# Patient Record
Sex: Male | Born: 1955
Health system: Southern US, Community
[De-identification: ages and names within clinical notes are randomized; demographics above are authoritative.]

## PROBLEM LIST (undated history)

## (undated) DIAGNOSIS — I351 Nonrheumatic aortic (valve) insufficiency: Secondary | ICD-10-CM

## (undated) DIAGNOSIS — R002 Palpitations: Secondary | ICD-10-CM

## (undated) DIAGNOSIS — R079 Chest pain, unspecified: Secondary | ICD-10-CM

## (undated) DIAGNOSIS — N200 Calculus of kidney: Secondary | ICD-10-CM

## (undated) DIAGNOSIS — Q231 Congenital insufficiency of aortic valve: Secondary | ICD-10-CM

## (undated) DIAGNOSIS — R911 Solitary pulmonary nodule: Secondary | ICD-10-CM

## (undated) DIAGNOSIS — I77810 Thoracic aortic ectasia: Secondary | ICD-10-CM

## (undated) DIAGNOSIS — B349 Viral infection, unspecified: Secondary | ICD-10-CM

## (undated) DIAGNOSIS — I10 Essential (primary) hypertension: Secondary | ICD-10-CM

## (undated) DIAGNOSIS — I493 Ventricular premature depolarization: Secondary | ICD-10-CM

## (undated) DIAGNOSIS — Z9049 Acquired absence of other specified parts of digestive tract: Secondary | ICD-10-CM

## (undated) DIAGNOSIS — R9431 Abnormal electrocardiogram [ECG] [EKG]: Secondary | ICD-10-CM

## (undated) DIAGNOSIS — Z789 Other specified health status: Secondary | ICD-10-CM

## (undated) DIAGNOSIS — R9430 Abnormal result of cardiovascular function study, unspecified: Secondary | ICD-10-CM

## (undated) DIAGNOSIS — E786 Lipoprotein deficiency: Secondary | ICD-10-CM

## (undated) DIAGNOSIS — I2510 Atherosclerotic heart disease of native coronary artery without angina pectoris: Secondary | ICD-10-CM

## (undated) DIAGNOSIS — IMO0002 Reserved for concepts with insufficient information to code with codable children: Secondary | ICD-10-CM

## (undated) HISTORY — DX: Reserved for concepts with insufficient information to code with codable children: IMO0002

## (undated) HISTORY — DX: Ventricular premature depolarization: I49.3

## (undated) HISTORY — DX: Other specified health status: Z78.9

## (undated) HISTORY — DX: Abnormal result of cardiovascular function study, unspecified: R94.30

## (undated) HISTORY — DX: Viral infection, unspecified: B34.9

## (undated) HISTORY — DX: Thoracic aortic ectasia: I77.810

## (undated) HISTORY — DX: Chest pain, unspecified: R07.9

## (undated) HISTORY — PX: KNEE ARTHROSCOPY: SHX127

## (undated) HISTORY — DX: Congenital insufficiency of aortic valve: Q23.1

## (undated) HISTORY — DX: Palpitations: R00.2

## (undated) HISTORY — DX: Abnormal electrocardiogram (ECG) (EKG): R94.31

## (undated) HISTORY — DX: Solitary pulmonary nodule: R91.1

## (undated) HISTORY — DX: Acquired absence of other specified parts of digestive tract: Z90.49

## (undated) HISTORY — DX: Nonrheumatic aortic (valve) insufficiency: I35.1

## (undated) HISTORY — DX: Essential (primary) hypertension: I10

## (undated) HISTORY — DX: Calculus of kidney: N20.0

## (undated) HISTORY — DX: Lipoprotein deficiency: E78.6

## (undated) HISTORY — PX: CHOLECYSTECTOMY: SHX55

## (undated) HISTORY — DX: Atherosclerotic heart disease of native coronary artery without angina pectoris: I25.10

---

## 1998-01-14 ENCOUNTER — Encounter: Payer: Self-pay | Admitting: Cardiology

## 2005-01-16 ENCOUNTER — Encounter: Admission: RE | Admit: 2005-01-16 | Discharge: 2005-01-16 | Payer: Self-pay | Admitting: Family Medicine

## 2005-02-02 ENCOUNTER — Encounter: Admission: RE | Admit: 2005-02-02 | Discharge: 2005-02-02 | Payer: Self-pay | Admitting: Family Medicine

## 2005-10-27 ENCOUNTER — Encounter: Admission: RE | Admit: 2005-10-27 | Discharge: 2005-10-27 | Payer: Self-pay | Admitting: Orthopedic Surgery

## 2005-11-22 ENCOUNTER — Ambulatory Visit (HOSPITAL_BASED_OUTPATIENT_CLINIC_OR_DEPARTMENT_OTHER): Admission: RE | Admit: 2005-11-22 | Discharge: 2005-11-22 | Payer: Self-pay | Admitting: Orthopedic Surgery

## 2008-04-02 DIAGNOSIS — R9431 Abnormal electrocardiogram [ECG] [EKG]: Secondary | ICD-10-CM

## 2008-04-02 HISTORY — DX: Abnormal electrocardiogram (ECG) (EKG): R94.31

## 2008-07-31 DIAGNOSIS — B349 Viral infection, unspecified: Secondary | ICD-10-CM | POA: Insufficient documentation

## 2008-07-31 HISTORY — DX: Viral infection, unspecified: B34.9

## 2008-08-31 ENCOUNTER — Encounter: Payer: Self-pay | Admitting: Cardiology

## 2008-09-30 DIAGNOSIS — I351 Nonrheumatic aortic (valve) insufficiency: Secondary | ICD-10-CM

## 2008-09-30 DIAGNOSIS — Q231 Congenital insufficiency of aortic valve: Secondary | ICD-10-CM

## 2008-09-30 DIAGNOSIS — Q2381 Bicuspid aortic valve: Secondary | ICD-10-CM

## 2008-09-30 DIAGNOSIS — R002 Palpitations: Secondary | ICD-10-CM

## 2008-09-30 DIAGNOSIS — R079 Chest pain, unspecified: Secondary | ICD-10-CM

## 2008-09-30 DIAGNOSIS — I77810 Thoracic aortic ectasia: Secondary | ICD-10-CM | POA: Insufficient documentation

## 2008-09-30 HISTORY — DX: Palpitations: R00.2

## 2008-09-30 HISTORY — DX: Thoracic aortic ectasia: I77.810

## 2008-09-30 HISTORY — DX: Nonrheumatic aortic (valve) insufficiency: I35.1

## 2008-09-30 HISTORY — DX: Bicuspid aortic valve: Q23.81

## 2008-09-30 HISTORY — DX: Chest pain, unspecified: R07.9

## 2008-09-30 HISTORY — DX: Congenital insufficiency of aortic valve: Q23.1

## 2008-10-15 ENCOUNTER — Ambulatory Visit: Payer: Self-pay

## 2008-10-15 ENCOUNTER — Ambulatory Visit: Payer: Self-pay | Admitting: Cardiology

## 2008-10-25 ENCOUNTER — Ambulatory Visit: Payer: Self-pay

## 2008-10-25 ENCOUNTER — Ambulatory Visit: Payer: Self-pay | Admitting: Cardiology

## 2008-10-25 ENCOUNTER — Encounter: Payer: Self-pay | Admitting: Cardiology

## 2008-10-25 LAB — CONVERTED CEMR LAB
Cholesterol: 163 mg/dL (ref 0–200)
HDL: 32.6 mg/dL — ABNORMAL LOW (ref >39.00)
LDL Cholesterol: 111 mg/dL — ABNORMAL HIGH (ref 0–99)
Total CHOL/HDL Ratio: 5
Triglycerides: 97 mg/dL (ref 0.0–149.0)
VLDL: 19.4 mg/dL (ref 0.0–40.0)

## 2008-11-05 ENCOUNTER — Encounter: Payer: Self-pay | Admitting: Cardiology

## 2008-11-11 ENCOUNTER — Encounter: Payer: Self-pay | Admitting: Cardiology

## 2009-05-04 ENCOUNTER — Encounter: Payer: Self-pay | Admitting: Cardiology

## 2009-08-24 ENCOUNTER — Emergency Department (HOSPITAL_COMMUNITY): Admission: EM | Admit: 2009-08-24 | Discharge: 2009-08-24 | Payer: Self-pay | Admitting: Emergency Medicine

## 2009-09-21 ENCOUNTER — Encounter: Payer: Self-pay | Admitting: Internal Medicine

## 2009-10-03 ENCOUNTER — Encounter: Payer: Self-pay | Admitting: Cardiology

## 2009-10-04 ENCOUNTER — Ambulatory Visit (HOSPITAL_COMMUNITY): Admission: RE | Admit: 2009-10-04 | Discharge: 2009-10-04 | Payer: Self-pay | Admitting: Cardiology

## 2009-10-04 ENCOUNTER — Ambulatory Visit: Payer: Self-pay | Admitting: Cardiology

## 2009-10-04 ENCOUNTER — Ambulatory Visit: Payer: Self-pay | Admitting: Internal Medicine

## 2009-10-10 ENCOUNTER — Encounter: Payer: Self-pay | Admitting: Cardiology

## 2009-10-14 ENCOUNTER — Telehealth: Payer: Self-pay | Admitting: Cardiology

## 2009-10-18 ENCOUNTER — Ambulatory Visit: Payer: Self-pay | Admitting: Internal Medicine

## 2009-12-02 ENCOUNTER — Encounter: Payer: Self-pay | Admitting: Cardiology

## 2010-01-23 ENCOUNTER — Telehealth: Payer: Self-pay | Admitting: Cardiovascular Disease

## 2010-01-25 ENCOUNTER — Encounter: Payer: Self-pay | Admitting: Cardiovascular Disease

## 2010-02-10 ENCOUNTER — Encounter: Payer: Self-pay | Admitting: Cardiology

## 2010-04-06 ENCOUNTER — Telehealth: Payer: Self-pay | Admitting: Cardiology

## 2010-04-30 LAB — CONVERTED CEMR LAB
Cholesterol: 154 mg/dL (ref 0–200)
HDL: 28.9 mg/dL — ABNORMAL LOW (ref >39.00)
LDL Cholesterol: 110 mg/dL — ABNORMAL HIGH (ref 0–99)
TSH: 1.96 microintl units/mL (ref 0.35–5.50)
Total CHOL/HDL Ratio: 5
Triglycerides: 76 mg/dL (ref 0.0–149.0)
VLDL: 15.2 mg/dL (ref 0.0–40.0)

## 2010-05-02 NOTE — Miscellaneous (Signed)
  Clinical Lists Changes  Problems: Removed problem of FAMILIAL COMBINED HYPERLIPIDEMIA (ICD-272.2) Added new problem of DYSLIPIDEMIA (ICD-272.4) Added new problem of * BETA BLOCKER INTOLERANCE Added new problem of BICUSPID AORTIC VALVE (ICD-746.4) Added new problem of * DILATED AORTIC ROOT Observations: Added new observation of PAST MED HX: Prolonged QT....mild PREMATURE VENTRICULAR CONTRACTIONS (ICD-427.69) PALPITATIONS (ICD-785.1).Marland KitchenMarland KitchenHolter..09/2008...one short burst  SVT.Marland Kitchen2 AM (he may have felt)..tried Lopressor..did not tolerate Beta Blocker intolerance   09/2008 Cholecystectomy Knee arthroscopy X4 Shoulder and clavicle procedure Prolonged viral illness may, 2010. HDL  low...Marland KitchenLDL  110.Marland KitchenMarland Kitchen7/2010 Chest Pain   Stress echo....09/2008...Marland Kitchennormal EF  50-55%...echo.Marland Kitchen.09/2008 Bicuspid aortic valve...echo.Marland Kitchen.09/2008 Dilated aortic root...  echo..09/2008.Marland KitchenMarland Kitchen44-32mm (10/03/2009 16:03) Added new observation of PRIMARY MD: Catha Gosselin, MD (10/03/2009 16:03)       Past History:  Past Medical History: Prolonged QT....mild PREMATURE VENTRICULAR CONTRACTIONS (ICD-427.69) PALPITATIONS (ICD-785.1).Marland KitchenMarland KitchenHolter..09/2008...one short burst  SVT.Marland Kitchen2 AM (he may have felt)..tried Lopressor..did not tolerate Beta Blocker intolerance   09/2008 Cholecystectomy Knee arthroscopy X4 Shoulder and clavicle procedure Prolonged viral illness may, 2010. HDL  low...Marland KitchenLDL  110.Marland KitchenMarland Kitchen7/2010 Chest Pain   Stress echo....09/2008...Marland Kitchennormal EF  50-55%...echo.Marland Kitchen.09/2008 Bicuspid aortic valve...echo.Marland Kitchen.09/2008 Dilated aortic root...  echo..09/2008.Marland KitchenMarland Kitchen44-54mm

## 2010-05-02 NOTE — Miscellaneous (Signed)
  Clinical Lists Changes  Observations: Added new observation of CARDIO HPI: CT angio looked good. I called Patient. Cornaries good. Sinus Valsalva 43mm. Otherwise, ascending aorta only 38mm.  Nish reviewed twice. Not sure why echo different. Same Rx for now.  (02/10/2010 8:35) Added new observation of REFERRING MD: Dr. Modesto Charon (02/10/2010 8:35) Added new observation of PRIMARY MD: Leodis Sias, MD   [facility] (02/10/2010 8:35)      Referring Provider:  Dr. Modesto Charon Primary Provider:  Leodis Sias, MD   [facility]   History of Present Illness: CT angio looked good. I called Patient. Cornaries good. Sinus Valsalva 43mm. Otherwise, ascending aorta only 38mm.  Nish reviewed twice. Not sure why echo different. Same Rx for now.   Appended Document:  Herbert Seta,  Please call patient and let him know that I want to see him in the office next March. This is sooner than I told him when I called him. There is no problem. I just want to coordinate all care then and see if any meds need to be adjusted. I want him to continue to randomly check his BP and call us if pressure is above 140/85.  Appended Document:  Left message to call back   Appended Document:  pt aware, reminder placed for March

## 2010-05-02 NOTE — Progress Notes (Signed)
Summary: personal call  Phone Note Call from Patient Call back at [phone]   Caller: Patient Reason for Call: Talk to Nurse Summary of Call: per pt called. personal call Initial call taken by: Lorne Skeens,  October 14, 2009 1:59 PM  Follow-up for Phone Call        spoke w/pt regarding BP readings and starting ramipril, new rx sent in pt will cont. to monitor Meredith Staggers, RN  October 14, 2009 2:13 PM     New/Updated Medications: RAMIPRIL 2.5 MG CAPS (RAMIPRIL) Take one capsule by mouth daily Prescriptions: RAMIPRIL 2.5 MG CAPS (RAMIPRIL) Take one capsule by mouth daily  #30 x 6   Entered by:   Meredith Staggers, RN   Authorized by:   Talitha Givens, MD, [facility]   Signed by:   Meredith Staggers, RN on 10/14/2009   Method used:   Electronically to        [facility] [phone]* (retail)       [address]       [place], [place]  [address]       Ph: [phone]       Fax: [phone]   RxID:   [id number]

## 2010-05-02 NOTE — Assessment & Plan Note (Signed)
Summary: Pulmonary consultation/ eval SPN    Visit Type:  Initial Consult Copy to:  Dr. Modesto Charon Primary Provider/Referring Provider:  Leodis Sias, MD   [facility]  CC:  Abnormal CT Chest.  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  October 18, 2009  1st pulmonary office eval p symptoms right flank similar to previous kidney stones 15 years > ER at [facility] with right kidney stone passed in and all better with incidental rml nodule.  Pt denies any significant sore throat, dysphagia, itching, sneezing,  nasal congestion or excess secretions,  fever, chills, sweats, unintended wt loss, pleuritic or exertional cp, hempoptysis, change in activity tolerance  orthopnea pnd or leg swelling.  Current Medications (verified): 1)  Multivitamins   Tabs (Multiple Vitamin) .... Daily 2)  Ramipril 2.5 Mg Caps (Ramipril) .... Take One Capsule By Mouth Daily  Allergies (verified): No Known Drug Allergies  Past History:  Past Medical History: Prolonged QT....mild 2010  /   467 ms.... July, 2011 PREMATURE VENTRICULAR CONTRACTIONS (ICD-427.69) PALPITATIONS (ICD-785.1).Marland KitchenMarland KitchenHolter..09/2008...one short burst  SVT.Marland Kitchen2 AM (he may have felt).(.tried Lopressor..did not tolerate) Beta Blocker intolerance   09/2008  (Metoprolol) Cholecystectomy Knee arthroscopy X4 Shoulder and clavicle procedure Prolonged viral illness may, 2010. HDL  low...Marland KitchenLDL  110.Marland KitchenMarland Kitchen7/2010 Chest Pain   Stress echo....09/2008...Marland Kitchennormal EF  50-55%...echo.Marland Kitchen.09/2008 Bicuspid aortic valve...echo.Marland Kitchen.09/2008 Aortic insufficiency  ... echo... July, 2010 Dilated aortic root...  echo..09/2008.Marland KitchenMarland Kitchen44-81mm /  July, 2011...  echo... 45-46 mm Nephrolithiasis...Marland KitchenMarland Kitchen stones in the past..... then recent stones passsed  08/2009 Hypertension.... borderline.... home blood pressure checks to be done..... 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 Very limited alcohol intake No other significant drugs Married with children  Review of Systems  The patient denies shortness of breath with activity, shortness of breath at rest, productive cough, non-productive cough, coughing up blood, chest pain, irregular heartbeats, acid heartburn, indigestion, loss of appetite, weight change, abdominal pain, difficulty swallowing, sore throat, tooth/dental problems, headaches, nasal congestion/difficulty breathing through nose, sneezing, itching, ear ache, anxiety, depression, hand/feet swelling, joint stiffness or pain, rash, change in color of mucus, and fever.    Vital Signs:  Patient profile:   55 year old male Weight:      212.25 pounds O2 Sat:      94 % on Room air Temp:     97.5 degrees F oral Pulse rate:   51 / minute BP sitting:   130 / 80  (left arm)  Vitals Entered By: Vernie Murders (October 18, 2009 1:44 PM)  O2 Flow:  Room air  Physical Exam  Additional Exam:  wt 210 > 212 October 18, 2009 HEENT: nl dentition, turbinates, and orophanx. Nl external ear canals without cough reflex NECK :  without JVD/Nodes/TM/ nl carotid upstrokes bilaterally LUNGS: no acc muscle use, clear to A and P bilaterally without cough on insp or exp maneuvers CV:  RRR  no s3 or murmur or increase in P2, no edema  ABD:  soft and nontender with nl excursion in the supine position. No bruits or organomegaly, bowel sounds nl MS:  warm without deformities, calf tenderness, cyanosis or clubbing SKIN: warm and dry without lesions   NEURO:  alert, approp, no deficits     CT of Chest  Procedure date:  09/21/2009  Findings:      Small solitary rml nodule probably in hoizontal fissure  placed in tickle for 1 year follow up  Impression & Recommendations:  Problem # 1:  PULMONARY NODULE  (ICD-518.89)  Extended discussion - this is most likely benign but this needs f/u  Discussed in detail all the  indications, usual  risks and alternatives  relative to the benefits with patient who agrees to proceed with limited CT    Orders: Consultation Level IV (16010) Consultation Level IV (93235)  Patient Instructions: 1)  Recommend a limited CT Scan of the RML nodule in one year and you have been placed in our tickle file for this purpose.

## 2010-05-02 NOTE — Progress Notes (Signed)
Summary: wanting CT scheduled  Phone Note Call from Patient Call back at Home Phone [phone]   Caller: Patient Summary of Call: pt wanting to know if he can schedule a CT scan that Dr Eden Emms talk to him about. He said [phone] Initial call taken by: Edman Circle,  January 23, 2010 10:45 AM  Follow-up for Phone Call        pt. scheduled for this Wed. 10/26 @ 1pm. Went over pt. instructions. Pt. aware & will call back with any further questions. Whitney Maeola Sarah RN  January 23, 2010 3:38 PM  Follow-up by: Whitney Maeola Sarah RN,  January 23, 2010 3:38 PM

## 2010-05-02 NOTE — Letter (Signed)
Summary: BP Readings  BP Readings   Imported By: Marylou Mccoy 10/10/2009 15:52:33  _____________________________________________________________________  External Attachment:    Type:   Image     Comment:   External Document  Appended Document: BP Readings BPs ranging from 130-150s/72-90, will have Dr Myrtis Ser review  Appended Document: BP Readings Heather, Please start ramipril 2.5 mg daily.  Have him check more BPs and let us know.  Appended Document: BP Readings have attempted to call pt several times since 7/12 phone rings several times and then fax machine picks up, will cont. to try and reach pt  Appended Document: BP Readings pt aware see phone note dated 7/15

## 2010-05-02 NOTE — Miscellaneous (Signed)
  Clinical Lists Changes  Observations: Added new observation of CARDIO HPI: I spoke with the patient.  His blood pressure is under good control with a small amount of Altace.  This will be continued.  The patient has been scheduled to have a cardiac CT angiogram in October under the guidance of Dr. Eden Emms and Shirlee Latch.  I spoke with the patient about this and he fully understands. (12/02/2009 13:10) Added new observation of VISIT TYPE: Chart update (12/02/2009 13:10) Added new observation of REFERRING MD: Dr. Modesto Charon (12/02/2009 13:10) Added new observation of PRIMARY MD: Leodis Sias, MD   [facility] (12/02/2009 13:10)      Visit Type:  Chart update Referring Provider:  Dr. Modesto Charon Primary Provider:  Leodis Sias, MD   [facility]   History of Present Illness: I spoke with the patient.  His blood pressure is under good control with a small amount of Altace.  This will be continued.  The patient has been scheduled to have a cardiac CT angiogram in October under the guidance of Dr. Eden Emms and Shirlee Latch.  I spoke with the patient about this and he fully understands.

## 2010-05-02 NOTE — Assessment & Plan Note (Signed)
Summary: f1y   Visit Type:  Follow-up Primary Provider:  Leodis Sias, MD   [facility]  CC:  bicuspid aortic valve.  History of Present Illness: The patient is seen for followup of palpitations.  He also has a bicuspid aortic valve with mild dilatation of the aortic root and aortic insufficiency.  I saw him last July, 2010.  He has been stable since then.  He's not had any significant chest pain.  He knows that his palpitations are from scattered PVCs and he is no longer bothered by them.  In 2010 I had tried to start low-dose Lopressor for palpitations and for the benefit of his aortic root even though he was not hypertensive.  He felt very poorly with a small amount of beta blocker and it was stopped.  Current Medications (verified): 1)  Multivitamins   Tabs (Multiple Vitamin) .... Daily  Allergies (verified): No Known Drug Allergies  Past History:  Past Medical History: Prolonged QT....mild 2010  /   467 ms.... July, 2011 PREMATURE VENTRICULAR CONTRACTIONS (ICD-427.69) PALPITATIONS (ICD-785.1).Marland KitchenMarland KitchenHolter..09/2008...one short burst  SVT.Marland Kitchen2 AM (he may have felt).(.tried Lopressor..did not tolerate) Beta Blocker intolerance   09/2008  (Metoprolol) Cholecystectomy Knee arthroscopy X4 Shoulder and clavicle procedure Prolonged viral illness may, 2010. HDL  low...Marland KitchenLDL  110.Marland KitchenMarland Kitchen7/2010 Chest Pain   Stress echo....09/2008...Marland Kitchennormal EF  50-55%...echo.Marland Kitchen.09/2008 Bicuspid aortic valve...echo.Marland Kitchen.09/2008 Aortic insufficiency  ... echo... July, 2010 Dilated aortic root...  echo..09/2008.Marland KitchenMarland Kitchen44-29mm /  July, 2011...  echo... 45-46 mm Nephrolithiasis...Marland KitchenMarland Kitchen stones in the past..... then recent stones passsed  08/2009 Hypertension.... borderline.... home blood pressure checks to be done..... July, 2011  Review of Systems       Patient denies fever, chills, headache, sweats, rash, change in vision, change in hearing, chest pain, cough, nausea vomiting, urinary symptoms.  All other systems are reviewed and are  negative.  Vital Signs:  Patient profile:   55 year old male Height:      73 inches Weight:      210 pounds BMI:     27.81 Pulse rate:   49 / minute BP sitting:   146 / 80  (left arm) Cuff size:   regular  Vitals Entered By: Hardin Negus, RMA (October 04, 2009 4:13 PM)  Physical Exam  General:  patient is quite stable. Head:  head is atraumatic. Eyes:  no xanthelasma. Neck:  no jugular venous distention. Chest Wall:  no chest wall tenderness. Lungs:  lungs are clear.  Respiratory effort is nonlabored. Heart:  cardiac exam reveals S1-S2.  There is a soft murmur of aortic insufficiency heard. Abdomen:  abdomen is soft. Msk:  no musculoskeletal deformities. Extremities:  no peripheral edema. Skin:  no skin rashes. Psych:  patient is oriented to person time and place.  Affect is normal.   Impression & Recommendations:  Problem # 1:  PALPITATIONS (ICD-785.1)  The following medications were removed from the medication list:    Metoprolol Succinate 25 Mg Xr24h-tab (Metoprolol succinate) ..... One by mouth once daily as directed  Orders: EKG w/ Interpretation (93000) EKG is done and reviewed by me.  There is sinus bradycardia.  The corrected QT interval is 467 ms.  Patient is no longer bothered by his scattered PVCs.  No further workup.  Problem # 2:  * BETA BLOCKER INTOLERANCE Unfortunately the patient did not tolerate low-dose Lopressor.  I will decide over time if other beta blockers her to be tried.  Problem # 3:  AORTIC INSUFFICIENCY (ICD-424.1)  The following medications were removed from  the medication list:    Metoprolol Succinate 25 Mg Xr24h-tab (Metoprolol succinate) ..... One by mouth once daily as directed The patient has aortic insufficiency that is mild/moderate.  This is related to his bicuspid aortic valve.  This will be followed over time.  Problem # 4:  * BLOOD PRESSURE Blood pressure is slightly elevated today.  He has had a slightly elevated blood  pressure recently when he had kidney stones.  He will be getting his blood pressure checked at home by his wife several times over the next few weeks.  He will pass this information to [place].  I have a low threshold to start him on blood pressure medication.  Problem # 5:  BICUSPID AORTIC VALVE (ICD-746.4)  The following medications were removed from the medication list:    Metoprolol Succinate 25 Mg Xr24h-tab (Metoprolol succinate) ..... One by mouth once daily as directed the patient has a bicuspid aortic valve and a dilated aortic root.  The 2-D echo today is similar to the past.  However there is suggestion that the aortic root measurement may be slightly larger.  It is now time to obtain a more exact study concerning the size of his root.  In addition a coarctation be ruled out.  This would seem to be extremely unlikely in his case but it is necessary to be ruled out definitively.  He will have either a heart attack CT or cardiac MR.  I am leaning towards MRI at this point and I will contact him with the information. The patient rides a bicycle long distance.  He is very competitive.  I have convinced him to ride for exercise purposes but not to go into very high levels of racing.  I will also remind him to have his son and daughter have two-dimensional echo to assess her aortic valve.  Problem # 6:  DYSLIPIDEMIA (ICD-272.4) In the past his LDL was 110 with an HDL of 28.  Triglycerides were 76.  He does not have coronary disease.  He has not wanted to take any other medications.  Problem # 7:  * QT INTERVAL MILD PROLONGATION The QT interval today is normal.  No further workup.  Patient Instructions: 1)  Your physician wants you to follow-up in: 1 year.  You will receive a reminder letter in the mail two months in advance. If you don't receive a letter, please call our office to schedule the follow-up appointment.  Appended Document: f1y Nish,   Here is Sidhant's last note from me.

## 2010-05-02 NOTE — Miscellaneous (Signed)
  Clinical Lists Changes  Observations: Added new observation of CARDIO HPI: The patient's Holter monitor from October 17, 2008, revealed very infrequent PVCs.  There were scattered PACs.  There was also 114 beat run of a narrow complex rhythm with a rate of 130 beats per minute.  I'm not sure the exact name for this rhythm..  It is also noted that there may have been some junctional beats before this burst of SVT. (05/04/2009 17:16) Added new observation of PRIMARY MD: Catha Gosselin, MD (05/04/2009 17:16)      Primary Provider:  Catha Gosselin, MD   History of Present Illness: The patient's Holter monitor from October 17, 2008, revealed very infrequent PVCs.  There were scattered PACs.  There was also 114 beat run of a narrow complex rhythm with a rate of 130 beats per minute.  I'm not sure the exact name for this rhythm..  It is also noted that there may have been some junctional beats before this burst of SVT.

## 2010-05-02 NOTE — Cardiovascular Report (Signed)
Summary: office visit  office visit   Imported By: Kassie Mends 06/09/2009 10:33:13  _____________________________________________________________________  External Attachment:    Type:   Image     Comment:   External Document

## 2010-05-04 NOTE — Progress Notes (Signed)
Summary: blood pressure  Phone Note Call from Patient Call back at Home Phone [phone] Call back at [phone]   Caller: Patient Reason for Call: Talk to Nurse Summary of Call: pt blood pressure 140-150/80. no chest pain no sob. pt states the dr told him to keep an eye on his blood pressure. Initial call taken by: Roe Coombs,  April 06, 2010 10:25 AM  Follow-up for Phone Call        spoke w/pt he states that around Christmas he noticed that his BP was up and it has cont. to stay up around 145-150/80s he is taking his altace 2.5mg  daily, will discuss w/Dr Boykin Nearing, RN  April 06, 2010 5:01 PM   Additional Follow-up for Phone Call Additional follow up Details #1::        Increase ramapril to 5mg  daily. Make sure he knows this is stil a small dose....thanks  Left message to call back Meredith Staggers, RN  April 07, 2010 9:02 AM   pt is aware and will double med, he just received a 90 day supply will call when low to get new rx for 5mg  tabs Meredith Staggers, RN  April 07, 2010 9:43 AM     New/Updated Medications: RAMIPRIL 2.5 MG CAPS (RAMIPRIL) Take 2 capsule by mouth daily

## 2010-05-17 ENCOUNTER — Telehealth: Payer: Self-pay | Admitting: Cardiology

## 2010-05-24 NOTE — Progress Notes (Signed)
Summary: refill request/change in pharmacy  Phone Note Refill Request Message from:  Patient on May 17, 2010 9:19 AM  Refills Requested: Medication #1:  RAMIPRIL 2.5 MG CAPS Take 2 capsule by mouth daily. pt states med was increased to 5mg /[facility]/3 month supply   Method Requested: Telephone to Pharmacy Initial call taken by: Glynda Jaeger,  May 17, 2010 9:20 AM    New/Updated Medications: RAMIPRIL 5 MG CAPS (RAMIPRIL) Take one capsule by mouth daily Prescriptions: RAMIPRIL 5 MG CAPS (RAMIPRIL) Take one capsule by mouth daily  #90 x 2   Entered by:   Hardin Negus, RMA   Authorized by:   Talitha Givens, MD, [facility]   Signed by:   Hardin Negus, RMA on 05/18/2010   Method used:   Electronically to        [facility] #[id number]* (retail)       [address]       [place], [place]  [address]       Ph: [phone]       Fax: [phone]   RxID:   [id number]

## 2010-06-19 LAB — URINALYSIS, ROUTINE W REFLEX MICROSCOPIC
Bilirubin Urine: NEGATIVE
Glucose, UA: NEGATIVE mg/dL
Ketones, ur: NEGATIVE mg/dL
Leukocytes, UA: NEGATIVE
Nitrite: NEGATIVE
Protein, ur: NEGATIVE mg/dL
Specific Gravity, Urine: 1.023 (ref 1.005–1.030)
Urobilinogen, UA: 0.2 mg/dL (ref 0.0–1.0)
pH: 5.5 (ref 5.0–8.0)

## 2010-06-19 LAB — URINE MICROSCOPIC-ADD ON

## 2010-08-18 NOTE — Op Note (Signed)
Howard Garrison, Howard Garrison           ACCOUNT NO.:  [id number]   MEDICAL RECORD NO.:  [id number]          PATIENT TYPE:  AMB   LOCATION:  DSC                          FACILITY:  [facility]   PHYSICIAN:  Rodney A. Mortenson, M.D.DATE OF BIRTH:  [birth year]   DATE OF PROCEDURE:  11/22/2005  DATE OF DISCHARGE:                                 OPERATIVE REPORT   JUSTIFICATION:  A 55 year old male had previous medial meniscectomy of left  knee four months ago, twisted his knee and has been having significant pain  along the medial joint line since that time.  Because of persistent pain and  discomfort, it was felt that arthroscopic evaluation and treatment was  indicated.  A new MRI was done that shows an abnormal appearance of the  posterior horn of the medial meniscus and this is suspicious for a new  degenerative tear in the area of previous partial meniscectomy.  Questions  were answered and encouraged preoperatively.  No guarantee will be given as  to outcome.  Complications were discussed preoperatively and the patient  wishes to proceed.   PREOPERATIVE DIAGNOSES:  Previous partial medial meniscectomy of left knee  with possible new tear, medial meniscal remnant.   POSTOPERATIVE DIAGNOSES:  Previous partial medial meniscectomy of left knee  with new tear, posterior remnant; grade II and grade III cartilage damage,  femoral notch area.   SURGEON:  Lenard Galloway. Chaney Malling, M.D.   OPERATION:  Arthroscopy;  repeat partial debridement, medial meniscus, left  knee;  aggressive chondroplasty, femoral notch area.   ANESTHESIA:  MAC.   PATHOLOGY:  With the arthroscope on knee, a very careful examination of the  knee was undertaken.  The articular cartilage on the posterior aspect of the  patella appeared fairly normal.  Some very minimal chondromalacia.  However,  in the femoral notch area, there is a very large area of grade III and grade  IV cartilage loss and damage.  The cartilage was slaking  off the femoral  notch area and there were areas where there was raw bone exposed.  This was  quite unstable.  The small flakes of cartilage floating in the knee.  The  arthroscope was passed into the intercondylar notch area.  Anterior cruciate  ligament was completely normal.  In the lateral compartment there is  absolutely normal articular cartilage over the lateral femoral condyle,  lateral tibial plateau, and the entire circumference of the lateral meniscus  was normal.  In the medial compartment, previous partial meniscectomy could  be seen where the posterior third of meniscus was debrided.  At the junction  of posterior third and mid third, there was a new tear.   PROCEDURE:  The patient was placed on the operating table in a supine  position with pneumatic tourniquet above left upper thigh.  The left leg was  placed in a leg holder and the entire left lower extremity was prepped with  DuraPrep and draped out in the usual manner.  Marcaine having been placed in  the knee, Xylocaine and epinephrine used to infiltrate the puncture wound.  An infusion cannula was placed in the  superior medial pouch and the knee  distended saline.  Anteromedial and anterolateral portals were then made and  arthroscope was introduced.   Attention was first turned to the femoral notch area.  Through both knee and  lateral portals, a full-radius resector was used to debride the notch area.  All unstable cartilage was removed. There was raw underlying bone exposed.  All the loose cartilage was removed.   Attention was then turned to the medial compartment.  Through both portals,  a series of baskets were inserted and the torn portion of the meniscus was  debrided once again.  This was followed up with the intra-articular shaver  and this remaining meniscus was smoothed and balanced and completely  debrided to a stable, smooth rim.  The anterior third of medial meniscus  appeared fairly normal.  Marcaine  was then placed, and a large bulky  compression dressing applied.  The patient returned to the recovery room in  excellent condition.  Technically, this procedure went extremely well.   FOLLOWUP CARE:  1. To my office on Wednesday.  2. Percocet for pain.           ______________________________  Lenard Galloway. Chaney Malling, M.D.     RAM/MEDQ  D:  11/22/2005  T:  11/22/2005  Job:  [id number]

## 2010-10-03 ENCOUNTER — Telehealth: Payer: Self-pay | Admitting: *Deleted

## 2010-10-03 DIAGNOSIS — R911 Solitary pulmonary nodule: Secondary | ICD-10-CM

## 2010-10-03 NOTE — Telephone Encounter (Signed)
[email or web address]  

## 2010-10-03 NOTE — Telephone Encounter (Signed)
Message copied by Christen Butter on Tue Oct 03, 2010 11:31 AM ------      Message from: Christen Butter      Created: Mon Jul 03, 2010  3:44 PM       Pt needs f/u ct chest- limited to rml

## 2010-10-06 NOTE — Telephone Encounter (Signed)
Spoke with pt and notified that he is due for CT Chest to followup nodule. Pt verbalized understanding and order was sent to [facility].

## 2010-10-13 ENCOUNTER — Ambulatory Visit (INDEPENDENT_AMBULATORY_CARE_PROVIDER_SITE_OTHER)
Admission: RE | Admit: 2010-10-13 | Discharge: 2010-10-13 | Disposition: A | Discharge status: Home / Self Care | Payer: 59 | Source: Ambulatory Visit | Attending: Internal Medicine | Admitting: Internal Medicine

## 2010-10-13 DIAGNOSIS — R911 Solitary pulmonary nodule: Secondary | ICD-10-CM

## 2010-10-13 DIAGNOSIS — J984 Other disorders of lung: Secondary | ICD-10-CM

## 2010-10-16 ENCOUNTER — Telehealth: Payer: Self-pay | Admitting: Internal Medicine

## 2010-10-16 NOTE — Telephone Encounter (Signed)
Pt advised of ct results per append. Carron Curie, CMA

## 2011-03-22 ENCOUNTER — Other Ambulatory Visit: Payer: Self-pay | Admitting: Cardiology

## 2011-04-27 ENCOUNTER — Other Ambulatory Visit: Payer: Self-pay | Admitting: Cardiology

## 2011-04-30 ENCOUNTER — Other Ambulatory Visit: Payer: Self-pay | Admitting: *Deleted

## 2011-04-30 MED ORDER — RAMIPRIL 5 MG PO CAPS: 5.0000 mg | ORAL_CAPSULE | Freq: Every day | ORAL | Status: DC

## 2011-05-02 ENCOUNTER — Telehealth: Payer: Self-pay | Admitting: *Deleted

## 2011-05-02 NOTE — Telephone Encounter (Signed)
Dr.Katz called me and advised that it was OK to refill Altace for this patient fo the next year. I called the patient on his cell phone and he advised me that he just picked up the medication from [facility] on [address] yesterday. He will have the pharmacy notify us when he needs it filled again because if we send it in today the insurance company will kick it out.

## 2011-07-17 ENCOUNTER — Other Ambulatory Visit: Payer: Self-pay | Admitting: Cardiology

## 2011-07-20 ENCOUNTER — Telehealth: Payer: Self-pay | Admitting: *Deleted

## 2011-07-20 ENCOUNTER — Encounter: Payer: Self-pay | Admitting: Cardiology

## 2011-07-20 NOTE — Progress Notes (Signed)
I follow this patient. I've spoken to him you know I haven't seen him in the office recently. He has permission to have his medications refilled for at least one additional year from today.

## 2011-07-20 NOTE — Telephone Encounter (Signed)
Thanks

## 2011-07-20 NOTE — Telephone Encounter (Signed)
I called and confirmed pharmacy with pt. Telephoned refill for 1 year of Ramipril Pt is aware and will pick up today. Mylo Red RN

## 2011-12-28 ENCOUNTER — Encounter: Payer: Self-pay | Admitting: *Deleted

## 2012-01-01 ENCOUNTER — Encounter: Payer: Self-pay | Admitting: Cardiology

## 2012-01-01 DIAGNOSIS — E786 Lipoprotein deficiency: Secondary | ICD-10-CM | POA: Insufficient documentation

## 2012-01-01 DIAGNOSIS — I2510 Atherosclerotic heart disease of native coronary artery without angina pectoris: Secondary | ICD-10-CM | POA: Insufficient documentation

## 2012-01-01 DIAGNOSIS — R9430 Abnormal result of cardiovascular function study, unspecified: Secondary | ICD-10-CM | POA: Insufficient documentation

## 2012-01-01 DIAGNOSIS — R911 Solitary pulmonary nodule: Secondary | ICD-10-CM | POA: Insufficient documentation

## 2012-01-01 DIAGNOSIS — Z789 Other specified health status: Secondary | ICD-10-CM | POA: Insufficient documentation

## 2012-01-01 DIAGNOSIS — R9431 Abnormal electrocardiogram [ECG] [EKG]: Secondary | ICD-10-CM | POA: Insufficient documentation

## 2012-01-01 DIAGNOSIS — N200 Calculus of kidney: Secondary | ICD-10-CM | POA: Insufficient documentation

## 2012-01-01 DIAGNOSIS — Z9049 Acquired absence of other specified parts of digestive tract: Secondary | ICD-10-CM | POA: Insufficient documentation

## 2012-01-01 DIAGNOSIS — IMO0002 Reserved for concepts with insufficient information to code with codable children: Secondary | ICD-10-CM | POA: Insufficient documentation

## 2012-01-01 DIAGNOSIS — I10 Essential (primary) hypertension: Secondary | ICD-10-CM | POA: Insufficient documentation

## 2012-01-01 DIAGNOSIS — I493 Ventricular premature depolarization: Secondary | ICD-10-CM | POA: Insufficient documentation

## 2012-01-03 ENCOUNTER — Encounter: Payer: Self-pay | Admitting: Cardiology

## 2012-01-03 ENCOUNTER — Ambulatory Visit (INDEPENDENT_AMBULATORY_CARE_PROVIDER_SITE_OTHER): Payer: BC Managed Care – PPO | Admitting: Cardiology

## 2012-01-03 VITALS — BP 128/76 | HR 51 | Ht 73.0 in | Wt 206.0 lb

## 2012-01-03 DIAGNOSIS — I77810 Thoracic aortic ectasia: Secondary | ICD-10-CM

## 2012-01-03 DIAGNOSIS — I10 Essential (primary) hypertension: Secondary | ICD-10-CM

## 2012-01-03 DIAGNOSIS — I2510 Atherosclerotic heart disease of native coronary artery without angina pectoris: Secondary | ICD-10-CM

## 2012-01-03 DIAGNOSIS — Q231 Congenital insufficiency of aortic valve: Secondary | ICD-10-CM

## 2012-01-03 DIAGNOSIS — R911 Solitary pulmonary nodule: Secondary | ICD-10-CM

## 2012-01-03 DIAGNOSIS — I351 Nonrheumatic aortic (valve) insufficiency: Secondary | ICD-10-CM

## 2012-01-03 DIAGNOSIS — I359 Nonrheumatic aortic valve disorder, unspecified: Secondary | ICD-10-CM

## 2012-01-03 DIAGNOSIS — R002 Palpitations: Secondary | ICD-10-CM

## 2012-01-03 DIAGNOSIS — R079 Chest pain, unspecified: Secondary | ICD-10-CM

## 2012-01-03 DIAGNOSIS — Q2381 Bicuspid aortic valve: Secondary | ICD-10-CM

## 2012-01-03 DIAGNOSIS — R9431 Abnormal electrocardiogram [ECG] [EKG]: Secondary | ICD-10-CM

## 2012-01-03 NOTE — Patient Instructions (Addendum)
Your physician wants you to follow-up in: ONE YEAR WITH DR. KATZ.  You will receive a reminder letter in the mail two months in advance. If you don't receive a letter, please call our office to schedule the follow-up appointment.   Your physician has requested that you have an echocardiogram. DX: BICUSPID AORTIC VALVE TO ASSESS THE AORTIC ROOT SIZE Echocardiography is a painless test that uses sound waves to create images of your heart. It provides your doctor with information about the size and shape of your heart and how well your heart's chambers and valves are working. This procedure takes approximately one hour. There are no restrictions for this procedure.   Your physician recommends that you continue on your current medications as directed. Please refer to the Current Medication list given to you today.

## 2012-01-03 NOTE — Progress Notes (Signed)
HPI  Patient is seen back for the evaluation of palpitations. He's also seen for mild hypertension. He is also seen for followup of bicuspid aortic valve and mild aortic root dilatation.   The patient has had significant family stress in the past 6 months. With this he has increased palpitations. We know that he has PVCs in the past. It is very clear that he senses some of these PVCs when he is stressed. He does not have prolonged palpitations. He has not had syncope or presyncope.  I followed the patient for many years with a bicuspid aortic valve. He has been asymptomatic. He's had mild to moderate aortic insufficiency but no significant aortic stenosis. Over time his echoes were suggesting that his aortic root might measure as large as 44 mm. He had a cardiac CT angiogram in November, 2011. That study showed that at the level of the sinus of Valsalva his aorta was 43 mm. However the ascending aorta and the aorta at the sinotubular junction were in the range of 38 mm. The CT angiogram did show his bicuspid aortic valve. He has no evidence of a coarctation of the aorta.  Not on File  Current Outpatient Prescriptions  Medication Sig Dispense Refill  . Multiple Vitamin (MULTIVITAMIN) capsule Take 1 capsule by mouth daily.      . ramipril (ALTACE) 5 MG capsule Take 1 capsule (5 mg total) by mouth daily.  30 capsule  1    History   Social History  . Marital Status: Married    Spouse Name: N/A    Number of Children: N/A  . Years of Education: N/A   Occupational History  . Not on file.   Social History Main Topics  . Smoking status: Never Smoker   . Smokeless tobacco: Not on file  . Alcohol Use: Yes     very limited  . Drug Use: No  . Sexually Active: Not on file   Other Topics Concern  . Not on file   Social History Narrative  . No narrative on file    Family History  Problem Relation Age of Onset  . Diabetes Mother   . Prostate cancer Father     Past Medical History    Diagnosis Date  . Prolonged Q-T interval on ECG 2010    .Marland KitchenMarland KitchenJuly, 2011  . PVC (premature ventricular contraction)   . Palpitations 09/2008    Holter, one short burst SVT  2 am  . Beta-blocker intolerance     July, 2010, metoprolol  . Viral illness 07/2008    prolonged  . Low HDL (under 40)     LDL 110 09/2008  . Chest pain 09/2008     stress echo.  ZOXW,9604.Marland Kitchennormal...EF % 50-55%  . Bicuspid aortic valve 09/2008    Bicuspid aortic valve echo, July, 2010  //  cardiac CT angiogram October, 2011, bicuspid aortic valve  . Aortic insufficiency 09/2008    Mild,/ Moderate  echo, July, 2010  . Dilated aortic root 09/2008    echo...44-75mm/july, 2011...echo..45-18mm  . Nephrolithiasis   . HTN (hypertension)     borderline  . S/P cholecystectomy   . Ejection fraction     EF 50-55%, echo, July, 2010  . Lung nodule     Triangular-shaped nodule along the minor fissure stable and likely subpleural lymph node, CT scan, October 16, 2010 no further CT scans needed by report  . CAD (coronary artery disease)     Calcium score of 11, single nidus  in the proximal LAD, cardiac CT angiogram  October, 2011    Past Surgical History  Procedure Date  . Cholecystectomy   . Knee arthroscopy     x 4    ROS   Patient denies fever, chills, headache, sweats, rash, change in vision, change in hearing, chest pain, cough, nausea vomiting, urinary symptoms. All other systems are reviewed and are negative.  PHYSICAL EXAM    Patient is oriented to person time and place. Affect is normal. There is no jugulovenous distention. Lungs are clear. Respiratory effort is nonlabored. Cardiac exam reveals S1 and S2. There is a soft murmur of aortic insufficiency. The abdomen is soft. There is no peripheral edema. There no musculoskeletal deformities. There no skin rashes.  EKG is done today and reviewed by me. There is normal sinus rhythm. EKG is normal.  ASSESSMENT & PLAN

## 2012-01-03 NOTE — Assessment & Plan Note (Addendum)
Echoes in the past have raised the question that his root might range up to 44-45 millimeters. CT angiogram revealed that the sinus of Valsalva was 43 mm. However the sinotubular junction was 36 mm in the ascending aorta 38 mm. This data was obtained in November, 2011. We will gather some data going forward now with an echo. I will probably wait a while before the next formal study of his aorta. We will probably do cardiac MR.   Because of his root size, I work with him over time to reduce  His exercise program. He did ride 100 miles this past weekend. However usually he rides in the range of an hour at 3 days a week. He is very careful not to raise or to push to try to keep up with others going up hills. He is extremely competitive. He is learning to ride in a place that is good for him with moderate exercise effort.

## 2012-01-03 NOTE — Assessment & Plan Note (Signed)
There was question of an abnormality on chest x-ray in 2012. The patient had a CT scan and it was felt that this was subpleural lymph node. It was recommended that no further workup was needed.

## 2012-01-03 NOTE — Assessment & Plan Note (Signed)
The QT interval has been slightly elevated in the past. It was 467 ms in 2011.

## 2012-01-03 NOTE — Assessment & Plan Note (Signed)
The patient has had no recurrent significant chest discomfort. He does not need any exercise testing at this time. He was able to ride his bike recently 100 miles in a weakened

## 2012-01-03 NOTE — Assessment & Plan Note (Addendum)
The patient had a calcium score of 11. He had a single nidus of calcium in the proximal LAD. He will plan to have a fasting lipid through his primary physician soon. After I see these labs we will decide if more aggressive therapy is needed.

## 2012-01-03 NOTE — Assessment & Plan Note (Signed)
Followup echo will also give [place] more information about his bicuspid valve. It is important to note that we are following his aortic root carefully and that we have ruled out coarctation of the aorta.

## 2012-01-03 NOTE — Assessment & Plan Note (Signed)
Patient's blood pressure is nicely controlled on low-dose Altace.

## 2012-01-03 NOTE — Assessment & Plan Note (Addendum)
The patient's palpitations recently related to stress. He has PVCs. He also had one very short bursts of SVT in the past the monitor. I feel he does not need any medications. If he has recurring problems we will consider another approach. He did not tolerate a beta blocker in the past. I might consider diltiazem. It is possible that he is having both PVCs and some very short bursts of supraventricular tachycardia. He has not had any prolonged arrhythmias.

## 2012-01-03 NOTE — Assessment & Plan Note (Signed)
We know that the patient has benign PVCs. No further workup.

## 2012-01-03 NOTE — Assessment & Plan Note (Signed)
The patient had mild to moderate aortic insufficiency in the past. This was also seen in 2011 when he had his CT angiogram. It is time for followup 2-D echo to reassess.

## 2012-01-10 ENCOUNTER — Ambulatory Visit (HOSPITAL_COMMUNITY): Payer: BC Managed Care – PPO | Attending: Cardiology | Admitting: Radiology

## 2012-01-10 DIAGNOSIS — I351 Nonrheumatic aortic (valve) insufficiency: Secondary | ICD-10-CM

## 2012-01-10 DIAGNOSIS — I379 Nonrheumatic pulmonary valve disorder, unspecified: Secondary | ICD-10-CM | POA: Insufficient documentation

## 2012-01-10 DIAGNOSIS — R9431 Abnormal electrocardiogram [ECG] [EKG]: Secondary | ICD-10-CM

## 2012-01-10 DIAGNOSIS — R911 Solitary pulmonary nodule: Secondary | ICD-10-CM

## 2012-01-10 DIAGNOSIS — I77810 Thoracic aortic ectasia: Secondary | ICD-10-CM

## 2012-01-10 DIAGNOSIS — I10 Essential (primary) hypertension: Secondary | ICD-10-CM

## 2012-01-10 DIAGNOSIS — I2510 Atherosclerotic heart disease of native coronary artery without angina pectoris: Secondary | ICD-10-CM | POA: Insufficient documentation

## 2012-01-10 DIAGNOSIS — R079 Chest pain, unspecified: Secondary | ICD-10-CM

## 2012-01-10 DIAGNOSIS — Q231 Congenital insufficiency of aortic valve: Secondary | ICD-10-CM | POA: Insufficient documentation

## 2012-01-10 DIAGNOSIS — R002 Palpitations: Secondary | ICD-10-CM

## 2012-01-10 DIAGNOSIS — I359 Nonrheumatic aortic valve disorder, unspecified: Secondary | ICD-10-CM

## 2012-01-10 NOTE — Progress Notes (Signed)
Echocardiogram performed.  

## 2012-02-21 ENCOUNTER — Encounter: Payer: Self-pay | Admitting: Cardiology

## 2012-02-21 NOTE — Progress Notes (Signed)
   I have reviewed the echos from 2011 and 2013. There is no significant change. LV function remains normal. Aortic insufficiency remains mild to moderate.  Measurement at the level of the sinus of Valsalva  2013 - 47 mm,    2011 - 46 mm Measurement at the sinotubular junction                 2013 - 36 mm,       2011 - 35 mm Measurement of the ascending aorta                       2013 - 43 mm,       2011 - 42 mm  There is no significant change. Further evaluation has not needed this year.Marland Kitchen

## 2012-09-04 ENCOUNTER — Other Ambulatory Visit: Payer: Self-pay

## 2012-09-04 MED ORDER — RAMIPRIL 5 MG PO CAPS: 5.0000 mg | ORAL_CAPSULE | Freq: Every day | ORAL | Status: DC

## 2012-11-16 ENCOUNTER — Other Ambulatory Visit: Payer: Self-pay | Admitting: Cardiology

## 2012-12-18 ENCOUNTER — Telehealth: Payer: Self-pay | Admitting: Cardiology

## 2012-12-18 NOTE — Telephone Encounter (Signed)
New Problem   Pt states he will run out of insurance by the end of the month and wants to see Dr. Myrtis Ser.. Advised pt that we could try and fit him with a NP or PA but he requests to have this ran through Dr. Myrtis Ser first to confirm that it is okay for him to see them. Pt request a call back to confirm,

## 2012-12-18 NOTE — Telephone Encounter (Signed)
Returned call to patient Dr.Katz's nurse out of office will sent message to her for appointment.

## 2012-12-19 NOTE — Telephone Encounter (Signed)
**Note De-identified Howard Garrison Obfuscation** Please advise 

## 2012-12-19 NOTE — Telephone Encounter (Signed)
Pt given appt for 9/22 at 4:15, pt verbalized understanding and agrees with appt. date and time.

## 2012-12-19 NOTE — Telephone Encounter (Signed)
Please add this patient to my schedule on Monday the 22nd at 4:30 PM. Once this is arranged please let you know that it has been verified. I know this patient very well and I want to be sure that I personally see him in the office

## 2012-12-19 NOTE — Telephone Encounter (Signed)
**Note De-identified Howard Garrison Obfuscation** LMTCB

## 2012-12-22 ENCOUNTER — Ambulatory Visit (INDEPENDENT_AMBULATORY_CARE_PROVIDER_SITE_OTHER): Payer: BC Managed Care – PPO | Admitting: Cardiology

## 2012-12-22 ENCOUNTER — Encounter: Payer: Self-pay | Admitting: Cardiology

## 2012-12-22 VITALS — BP 134/82 | HR 59 | Wt 210.0 lb

## 2012-12-22 DIAGNOSIS — I10 Essential (primary) hypertension: Secondary | ICD-10-CM

## 2012-12-22 DIAGNOSIS — R9430 Abnormal result of cardiovascular function study, unspecified: Secondary | ICD-10-CM

## 2012-12-22 DIAGNOSIS — I359 Nonrheumatic aortic valve disorder, unspecified: Secondary | ICD-10-CM

## 2012-12-22 DIAGNOSIS — IMO0002 Reserved for concepts with insufficient information to code with codable children: Secondary | ICD-10-CM

## 2012-12-22 DIAGNOSIS — Q2381 Bicuspid aortic valve: Secondary | ICD-10-CM

## 2012-12-22 DIAGNOSIS — R0989 Other specified symptoms and signs involving the circulatory and respiratory systems: Secondary | ICD-10-CM

## 2012-12-22 DIAGNOSIS — Q231 Congenital insufficiency of aortic valve: Secondary | ICD-10-CM

## 2012-12-22 DIAGNOSIS — E786 Lipoprotein deficiency: Secondary | ICD-10-CM

## 2012-12-22 DIAGNOSIS — I77810 Thoracic aortic ectasia: Secondary | ICD-10-CM

## 2012-12-22 DIAGNOSIS — I351 Nonrheumatic aortic (valve) insufficiency: Secondary | ICD-10-CM

## 2012-12-22 DIAGNOSIS — R002 Palpitations: Secondary | ICD-10-CM

## 2012-12-22 MED ORDER — RAMIPRIL 5 MG PO CAPS: 5.0000 mg | ORAL_CAPSULE | Freq: Every day | ORAL | Status: DC

## 2012-12-22 NOTE — Assessment & Plan Note (Signed)
The patient's LDL was 110 in the past. His HDL is under 40. He had a chest CTA in 2011. Calcium score was 11. A preliminary 10 year risk score done today based on older data suggests a 10 year risk of 10%. We need to consider whether statin therapy is appropriate. We will obtain labs from his primary care office.

## 2012-12-22 NOTE — Assessment & Plan Note (Signed)
Blood pressure is controlled on his current medication.

## 2012-12-22 NOTE — Assessment & Plan Note (Signed)
The patient does have a bicuspid aortic valve. All aspects of this are being followed carefully.

## 2012-12-22 NOTE — Assessment & Plan Note (Signed)
The patient's aortic insufficiency was stable. It was mild to moderate by echo in October, 2013. He does not need an echo at this time.

## 2012-12-22 NOTE — Progress Notes (Signed)
HPI  Patient is seen today to followup aortic insufficiency, bicuspid aortic valve, hypertension. He is very active healthy gentleman. I saw him last a year ago. He is not having any palpitations. Is not having any chest discomfort.  No Known Allergies  Current Outpatient Prescriptions  Medication Sig Dispense Refill  . Multiple Vitamin (MULTIVITAMIN) capsule Take 1 capsule by mouth daily.      . ramipril (ALTACE) 5 MG capsule Take 1 capsule (5 mg total) by mouth daily.  90 capsule  3   No current facility-administered medications for this visit.    History   Social History  . Marital Status: Married    Spouse Name: N/A    Number of Children: N/A  . Years of Education: N/A   Occupational History  . Not on file.   Social History Main Topics  . Smoking status: Never Smoker   . Smokeless tobacco: Not on file  . Alcohol Use: Yes     Comment: very limited  . Drug Use: No  . Sexual Activity: Not on file   Other Topics Concern  . Not on file   Social History Narrative  . No narrative on file    Family History  Problem Relation Age of Onset  . Diabetes Mother   . Prostate cancer Father     Past Medical History  Diagnosis Date  . Prolonged Q-T interval on ECG 2010    .Marland KitchenMarland KitchenJuly, 2011  . PVC (premature ventricular contraction)   . Palpitations 09/2008    Holter, one short burst SVT  2 am  . Beta-blocker intolerance     July, 2010, metoprolol  . Viral illness 07/2008    prolonged  . Low HDL (under 40)     LDL 110 09/2008  . Chest pain 09/2008     stress echo.  ZOXW,9604.Marland Kitchennormal...EF % 50-55%  . Bicuspid aortic valve 09/2008    Bicuspid aortic valve echo, July, 2010  //  cardiac CT angiogram October, 2011, bicuspid aortic valve  . Aortic insufficiency 09/2008    Mild,/ Moderate  echo, July, 2010  . Dilated aortic root 09/2008    echo...44-42mm/july, 2011...echo..45-77mm  . Nephrolithiasis   . HTN (hypertension)     borderline  . S/P cholecystectomy   .  Ejection fraction     EF 50-55%, echo, July, 2010  . Lung nodule     Triangular-shaped nodule along the minor fissure stable and likely subpleural lymph node, CT scan, October 16, 2010 no further CT scans needed by report  . CAD (coronary artery disease)     Calcium score of 11, single nidus in the proximal LAD, cardiac CT angiogram  October, 2011    Past Surgical History  Procedure Laterality Date  . Cholecystectomy    . Knee arthroscopy      x 4    Patient Active Problem List   Diagnosis Date Noted  . Lung nodule   . Prolonged Q-T interval on ECG   . PVC (premature ventricular contraction)   . Beta-blocker intolerance   . Low HDL (under 40)   . Nephrolithiasis   . HTN (hypertension)   . S/P cholecystectomy   . Ejection fraction   . CAD (coronary artery disease)   . Palpitations 09/30/2008  . Chest pain 09/30/2008  . Aortic insufficiency 09/30/2008  . Bicuspid aortic valve 09/30/2008  . Dilated aortic root 09/30/2008  . Viral illness 07/31/2008    ROS   Patient denies fever, chills, headache, sweats, rash,  change in vision, change in hearing, chest pain, cough, nausea vomiting, urinary symptoms. All other systems are reviewed and are negative.  PHYSICAL EXAM   Patient is oriented to person time and place. Affect is normal. There is no jugulovenous distention. Lungs are clear. Respiratory effort is nonlabored. Cardiac exam reveals S1 and S2. His murmur of aortic insufficiency is very soft. The abdomen is soft. There is no peripheral edema.  Filed Vitals:   12/22/12 1620  BP: 134/82  Pulse: 59  Weight: 210 lb (95.255 kg)     ASSESSMENT & PLAN

## 2012-12-22 NOTE — Patient Instructions (Addendum)
**Note De-identified Carlyann Placide Obfuscation** Your physician recommends that you continue on your current medications as directed. Please refer to the Current Medication list given to you today.  Your physician wants you to follow-up in: 1 year. You will receive a reminder letter in the mail two months in advance. If you don't receive a letter, please call our office to schedule the follow-up appointment.  

## 2012-12-22 NOTE — Assessment & Plan Note (Signed)
Echo measurements have suggested some dilatation of the aortic root. Cardiac CTA in October, 2011 reveal that the sinus of Valsalva was 43 mm. Sinotubular junction was 36 mm in the ascending aorta was 38 mm. These were very reassuring numbers. He had an echo in October, 2013. There was no significant change in the aortic root. He does not need a followup study at this time.

## 2012-12-22 NOTE — Assessment & Plan Note (Signed)
Patient is not having any palpitations. In the past he had one very short burst of SVT.

## 2012-12-26 ENCOUNTER — Encounter: Payer: Self-pay | Admitting: Cardiology

## 2014-01-05 ENCOUNTER — Other Ambulatory Visit: Payer: Self-pay | Admitting: Cardiology

## 2014-01-06 ENCOUNTER — Other Ambulatory Visit: Payer: Self-pay | Admitting: Cardiology

## 2014-01-22 ENCOUNTER — Other Ambulatory Visit: Payer: Self-pay

## 2014-01-22 ENCOUNTER — Encounter: Payer: Self-pay | Admitting: Cardiology

## 2014-01-22 MED ORDER — RAMIPRIL 5 MG PO CAPS: 5.0000 mg | ORAL_CAPSULE | Freq: Every day | ORAL | Status: DC

## 2014-01-22 NOTE — Telephone Encounter (Signed)
This refill was per verbal order of Dr. Myrtis SerKatz today.

## 2014-01-22 NOTE — Progress Notes (Signed)
Patient ID: Howard Garrison, male   DOB: 02-Nov-1955, 58 y.o.   MRN: [id number] The patient has run out of his Ramapril. This will be renewed for one year.  Jerral BonitoJeff Jorah Hua, MD

## 2014-02-17 ENCOUNTER — Ambulatory Visit (INDEPENDENT_AMBULATORY_CARE_PROVIDER_SITE_OTHER): Payer: BC Managed Care – PPO | Admitting: Cardiology

## 2014-02-17 ENCOUNTER — Encounter: Payer: Self-pay | Admitting: Cardiology

## 2014-02-17 VITALS — BP 122/74 | HR 61 | Ht 72.0 in | Wt 203.0 lb

## 2014-02-17 DIAGNOSIS — R002 Palpitations: Secondary | ICD-10-CM

## 2014-02-17 DIAGNOSIS — I10 Essential (primary) hypertension: Secondary | ICD-10-CM

## 2014-02-17 DIAGNOSIS — I2510 Atherosclerotic heart disease of native coronary artery without angina pectoris: Secondary | ICD-10-CM

## 2014-02-17 DIAGNOSIS — Q231 Congenital insufficiency of aortic valve: Secondary | ICD-10-CM

## 2014-02-17 DIAGNOSIS — F419 Anxiety disorder, unspecified: Secondary | ICD-10-CM | POA: Insufficient documentation

## 2014-02-17 NOTE — Assessment & Plan Note (Signed)
The patient has had stress recently. With this he has had difficulty with his sleep pattern. Recently this has become worse. In the past he's had some problems with anxiety. These were always self-limited. However today he appears more anxious. He agrees that he is feeling significant anxiety. I believe that this is the basis of his problems today. I have decided to proceed with no cardiac testing at this point. He will be making arrangements to get help for his anxiety.

## 2014-02-17 NOTE — Progress Notes (Signed)
Patient ID: Howard Garrison, male   DOB: 04/23/1955, 58 y.o.   MRN: [id number]    HPI Patient is seen today for the assessment of palpitations and chest discomfort. He is a very active and healthy 58 year old who has known bicuspid aortic valve with mild aortic insufficiency. He has mild dilatation of the aortic root but no dilatation of the ascending aorta. Recently he has had significant problems with feeling anxious. He feels some palpitations. He has not had any prolonged palpitations. He feels a vague sensation of chest tightness. It is not exertional. It is constant when he is at rest. Over the past day or 2 he has not rested well and he continues to feel worse. He has had a problem with mild anxiety in the past but currently he says that he feels much more anxious than he has in the past.  No Known Allergies  Current Outpatient Prescriptions  Medication Sig Dispense Refill  . Multiple Vitamin (MULTIVITAMIN) capsule Take 1 capsule by mouth daily.    . ramipril (ALTACE) 5 MG capsule Take 1 capsule (5 mg total) by mouth daily. 30 capsule 11   No current facility-administered medications for this visit.    History   Social History  . Marital Status: Married    Spouse Name: N/A    Number of Children: N/A  . Years of Education: N/A   Occupational History  . Not on file.   Social History Main Topics  . Smoking status: Never Smoker   . Smokeless tobacco: Not on file  . Alcohol Use: Yes     Comment: very limited  . Drug Use: No  . Sexual Activity: Not on file   Other Topics Concern  . Not on file   Social History Narrative    Family History  Problem Relation Age of Onset  . Diabetes Mother   . Prostate cancer Father     Past Medical History  Diagnosis Date  . Prolonged Q-T interval on ECG 2010    467ms.Marland Kitchen.Marland Kitchen.July, 2011  . PVC (premature ventricular contraction)   . Palpitations 09/2008    Holter, one short burst SVT  2 am  . Beta-blocker intolerance     July, 2010,  metoprolol  . Viral illness 07/2008    prolonged  . Low HDL (under 40)     LDL 110 09/2008  . Chest pain 09/2008     stress echo.  WUJW,1191July,2010.Marland Kitchen.normal...EF % 50-55%  . Bicuspid aortic valve 09/2008    Bicuspid aortic valve echo, July, 2010  //  cardiac CT angiogram October, 2011, bicuspid aortic valve  . Aortic insufficiency 09/2008    Mild,/ Moderate  echo, July, 2010  . Dilated aortic root 09/2008    echo...44-8145mm/july, 2011...echo..45-6646mm  . Nephrolithiasis   . HTN (hypertension)     borderline  . S/P cholecystectomy   . Ejection fraction     EF 50-55%, echo, July, 2010  . Lung nodule     Triangular-shaped nodule along the minor fissure stable and likely subpleural lymph node, CT scan, October 16, 2010 no further CT scans needed by report  . CAD (coronary artery disease)     Calcium score of 11, single nidus in the proximal LAD, cardiac CT angiogram  October, 2011    Past Surgical History  Procedure Laterality Date  . Cholecystectomy    . Knee arthroscopy      x 4    Patient Active Problem List   Diagnosis Date Noted  . Lung  nodule   . Prolonged Q-T interval on ECG   . PVC (premature ventricular contraction)   . Beta-blocker intolerance   . Low HDL (under 40)   . Nephrolithiasis   . HTN (hypertension)   . S/P cholecystectomy   . Ejection fraction   . CAD (coronary artery disease)   . Palpitations 09/30/2008  . Chest pain 09/30/2008  . Aortic insufficiency 09/30/2008  . Bicuspid aortic valve 09/30/2008  . Dilated aortic root 09/30/2008  . Viral illness 07/31/2008    ROS  Patient denies fever, chills, headache, sweats, rash, change in vision, change in hearing, cough, nausea or vomiting, urinary symptoms. All other systems are reviewed and are negative.  PHYSICAL EXAM Patient is oriented to person time and place. Affect is normal. He appears visually anxious. His communication is normal. He has a resting tremor that he has had. Head is atraumatic. Sclera and  conjunctiva are normal. There is no jugular venous distention. Lungs are clear. Respiratory effort is nonlabored. Cardiac exam reveals an S1 and S2. There are no clicks or significant murmurs. The abdomen is soft. There is no peripheral edema. There are no musculoskeletal deformities. There are no skin rashes.  Filed Vitals:   02/17/14 1619  BP: 122/74  Pulse: 61  Height: 6' (1.829 m)  Weight: 203 lb (92.08 kg)   EKG is done today and reviewed by me. There is normal sinus rhythm. The EKG is normal.  ASSESSMENT & PLAN

## 2014-02-17 NOTE — Assessment & Plan Note (Signed)
The patient had a slightly abnormal calcium score of 11 in the past by chest CT. Computation now shows that his 10 year risk scores 5.7%. I will talk with him in the future about whether a statin should be considered. I feel that his current symptoms are not related to coronary artery disease.

## 2014-02-17 NOTE — Patient Instructions (Signed)
Your physician recommends that you continue on your current medications as directed. Please refer to the Current Medication list given to you today.  Your physician recommends that you schedule a follow-up appointment in: as needed  

## 2014-02-17 NOTE — Assessment & Plan Note (Signed)
The patient has a bicuspid aortic valve. He has mild dilatation of the aortic root. He will need a follow-up echo and further assessment in the future. However this is stable at this time. I feel that none of his current symptoms today are related to this problem.

## 2014-02-17 NOTE — Assessment & Plan Note (Signed)
Blood pressure is under good control. No change in therapy 

## 2014-07-08 ENCOUNTER — Other Ambulatory Visit: Payer: Self-pay | Admitting: Family Medicine

## 2014-07-08 DIAGNOSIS — N23 Unspecified renal colic: Secondary | ICD-10-CM

## 2014-07-13 ENCOUNTER — Ambulatory Visit
Admission: RE | Admit: 2014-07-13 | Discharge: 2014-07-13 | Disposition: A | Discharge status: Home / Self Care | Payer: No Typology Code available for payment source | Source: Ambulatory Visit | Attending: Family Medicine | Admitting: Family Medicine

## 2014-07-13 DIAGNOSIS — N23 Unspecified renal colic: Secondary | ICD-10-CM

## 2014-08-23 ENCOUNTER — Other Ambulatory Visit: Payer: Self-pay

## 2014-08-23 ENCOUNTER — Encounter: Payer: Self-pay | Admitting: Cardiology

## 2014-08-23 MED ORDER — RAMIPRIL 10 MG PO CAPS: 10.0000 mg | ORAL_CAPSULE | Freq: Every day | ORAL | Status: DC

## 2014-08-23 NOTE — Progress Notes (Signed)
The patient's blood pressure has gone up recently into the range of 150-160. I spoke with him by telephone today. We will increase his Ramipril from 5 mg to 10 mg daily.  Jerral BonitoJeff Sire Poet, MD

## 2014-09-03 ENCOUNTER — Other Ambulatory Visit: Payer: Self-pay

## 2014-09-03 ENCOUNTER — Other Ambulatory Visit: Payer: Self-pay | Admitting: Cardiology

## 2014-09-03 MED ORDER — RAMIPRIL 10 MG PO CAPS: 10.0000 mg | ORAL_CAPSULE | Freq: Every day | ORAL | Status: DC

## 2014-09-03 MED ORDER — AMLODIPINE BESYLATE 2.5 MG PO TABS: 2.5000 mg | ORAL_TABLET | Freq: Every day | ORAL | Status: DC

## 2014-11-28 ENCOUNTER — Other Ambulatory Visit: Payer: Self-pay | Admitting: Cardiology

## 2014-12-03 ENCOUNTER — Other Ambulatory Visit: Payer: Self-pay

## 2014-12-03 MED ORDER — AMLODIPINE BESYLATE 2.5 MG PO TABS: 7.5000 mg | ORAL_TABLET | Freq: Every day | ORAL | Status: DC

## 2014-12-06 ENCOUNTER — Encounter: Payer: Self-pay | Admitting: Cardiology

## 2014-12-06 NOTE — Progress Notes (Signed)
Over several months I have reviewed BPs with patient by telephone. I have added amlodipine and titrated up to 7.5 mg daily.  Jerral Bonito, MD

## 2015-02-21 ENCOUNTER — Ambulatory Visit: Payer: Self-pay | Admitting: Cardiovascular Disease

## 2015-03-10 ENCOUNTER — Other Ambulatory Visit: Payer: Self-pay | Admitting: Urology

## 2015-03-10 DIAGNOSIS — N1330 Unspecified hydronephrosis: Secondary | ICD-10-CM

## 2015-03-10 DIAGNOSIS — R109 Unspecified abdominal pain: Secondary | ICD-10-CM

## 2015-03-16 ENCOUNTER — Ambulatory Visit
Admission: RE | Admit: 2015-03-16 | Discharge: 2015-03-16 | Disposition: A | Discharge status: Home / Self Care | Payer: No Typology Code available for payment source | Source: Ambulatory Visit | Attending: Urology | Admitting: Urology

## 2015-03-16 DIAGNOSIS — N1330 Unspecified hydronephrosis: Secondary | ICD-10-CM

## 2015-03-16 DIAGNOSIS — R109 Unspecified abdominal pain: Secondary | ICD-10-CM

## 2015-04-22 ENCOUNTER — Ambulatory Visit: Payer: Self-pay | Admitting: Cardiovascular Disease

## 2015-07-19 NOTE — Progress Notes (Signed)
Patient ID: Howard Garrison, male   DOB: 08/11/1955, 60 y.o.   MRN: [id number]    60 y.o. previously followed by Dr Ron Parker. 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  History of HTN and bicuspid AV with Mild AR.  Also history of anxiety and benign palpitations Last echo 01/10/12 reviewed  Study Conclusions  - Left ventricle: The cavity size was normal. Wall thickness was normal. The estimated ejection fraction was 55%. Wall motion was normal; there were no regional wall motion abnormalities. Left ventricular diastolic function parameters were normal. - Aortic valve: Bicuspid; mildly calcified leaflets. Mild-moderate eccentric aortic insufficiency. There was no stenosis. Mean gradient: 65m Hg (S). Peak gradient: 136mHg (S). - Aorta: Moderately dilated aortic root, mildly dilated ascending aorta. Aortic root dimension:4683mED). Ascending aortic diameter: 76m58m). - Mitral valve: No significant regurgitation. - Left atrium: The atrium was mildly dilated. - Right ventricle: The cavity size was normal. Systolic function was normal. - Right atrium: The atrium was mildly dilated. - Pulmonary arteries: No complete TR doppler jet so unable to estimate PA systolic pressure. - Inferior vena cava: The vessel was normal in size; the respirophasic diameter changes were in the normal range (= 50%); findings are consistent with normal central venous pressure. Impressions:  - Normal LV size and systolic function, EF 55%.62%cuspid aortic valve with mild-moderate aortic insufficiency. Moderately dilated aortic root, mildly dilated ascending aorta.  No Known Allergies  Current Outpatient Prescriptions  Medication Sig Dispense Refill  . amLODipine (NORVASC) 2.5 MG tablet Take 3 tablets (7.5 mg total) by mouth daily. 270 tablet 3  . Multiple Vitamin (MULTIVITAMIN) capsule Take 1  capsule by mouth daily.    . ramipril (ALTACE) 10 MG capsule Take 1 capsule (10 mg total) by mouth daily. 90 capsule 3   No current facility-administered medications for this visit.    Social History   Social History  . Marital Status: Married    Spouse Name: N/A  . Number of Children: N/A  . Years of Education: N/A   Occupational History  . Not on file.   Social History Main Topics  . Smoking status: Never Smoker   . Smokeless tobacco: Not on file  . Alcohol Use: Yes     Comment: very limited  . Drug Use: No  . Sexual Activity: Not on file   Other Topics Concern  . Not on file   Social History Narrative    Family History  Problem Relation Age of Onset  . Diabetes Mother   . Prostate cancer Father     Past Medical History  Diagnosis Date  . Prolonged Q-T interval on ECG 2010    467ms63mulMarland KitchenMarland Kitchen 2011  . PVC (premature ventricular contraction)   . Palpitations 09/2008    Holter, one short burst SVT  2 am  . Beta-blocker intolerance     July, 2010, metoprolol  . Viral illness 07/2008    prolonged  . Low HDL (under 40)     LDL 110 09/2008  . Chest pain 09/2008     stress echo.  July,UQJF,3545mMarland Kitchenl...EF % 50-55%  . Bicuspid aortic valve 09/2008    Bicuspid aortic valve echo, July, 2010  //  cardiac CT angiogram October, 2011, bicuspid aortic valve  . Aortic insufficiency 09/2008    Mild,/ Moderate  echo, July, 2010  . Dilated aortic root 09/2008  echo...44-37m/july, 2011...echo..45-472m . Nephrolithiasis   . HTN (hypertension)     borderline  . S/P cholecystectomy   . Ejection fraction     EF 50-55%, echo, July, 2010  . Lung nodule     Triangular-shaped nodule along the minor fissure stable and likely subpleural lymph node, CT scan, October 16, 2010 no further CT scans needed by report  . CAD (coronary artery disease)     Calcium score of 11, single nidus in the proximal LAD, cardiac CT angiogram  October, 2011    Past Surgical History  Procedure Laterality Date   . Cholecystectomy    . Knee arthroscopy      x 4    Patient Active Problem List   Diagnosis Date Noted  . Anxiety 02/17/2014  . Lung nodule   . Prolonged Q-T interval on ECG   . PVC (premature ventricular contraction)   . Beta-blocker intolerance   . Low HDL (under 40)   . Nephrolithiasis   . HTN (hypertension)   . S/P cholecystectomy   . Ejection fraction   . CAD (coronary artery disease)   . Palpitations 09/30/2008  . Chest pain 09/30/2008  . Aortic insufficiency 09/30/2008  . Bicuspid aortic valve 09/30/2008  . Dilated aortic root ([place]/04/2008  . Viral illness 07/31/2008    ROS  Patient denies fever, chills, headache, sweats, rash, change in vision, change in hearing, cough, nausea or vomiting, urinary symptoms. All other systems are reviewed and are negative.  Affect appropriate Healthy:  appears stated age HE1normal Neck supple with no adenopathy JVP normal no bruits no thyromegaly Lungs clear with no wheezing and good diaphragmatic motion Heart:  S1/S2 no murmur, no rub, gallop or click PMI normal Abdomen: benighn, BS positve, no tenderness, no AAA no bruit.  No HSM or HJR Distal pulses intact with no bruits No edema Neuro non-focal Skin warm and dry No muscular weakness   ECG:  02/17/14  SR rate 68 normal ECG    ASSESSMENT & PLAN HTN: Bicuspid AV: Palpitations: Anxiety:   PeJenkins Rouge This encounter was created in error - please disregard.

## 2015-07-22 ENCOUNTER — Encounter: Payer: Self-pay | Admitting: Cardiovascular Disease

## 2015-09-02 ENCOUNTER — Ambulatory Visit: Payer: Self-pay | Admitting: Cardiovascular Disease

## 2015-09-19 NOTE — Progress Notes (Signed)
Patient ID: Howard Garrison, male   DOB: 1955-04-09, 60 y.o.   MRN: [id number]    HPI New patient to me previously seen by Dr Myrtis SerKatz. Marland Kitchen. He is a very active and healthy 60 year old who has known bicuspid aortic valve with mild aortic insufficiency. He has mild dilatation of the aortic root but no dilatation of the ascending aorta BP meds been increased latter part of 2016.  Increasing issues with anxiety  Last echo 01/2012 reviewed: Study Conclusions  - Left ventricle: The cavity size was normal. Wall thickness was normal. The estimated ejection fraction was 55%. Wall motion was normal; there were no regional wall motion abnormalities. Left ventricular diastolic function parameters were normal. - Aortic valve: Bicuspid; mildly calcified leaflets. Mild-moderate eccentric aortic insufficiency. There was no stenosis. Mean gradient: 6mm Hg (S). Peak gradient: 15mm Hg (S). - Aorta: Moderately dilated aortic root, mildly dilated ascending aorta. Aortic root dimension:5546mm (ED). Ascending aortic diameter: 42mm (S). - Mitral valve: No significant regurgitation. - Left atrium: The atrium was mildly dilated. - Right ventricle: The cavity size was normal. Systolic function was normal. - Right atrium: The atrium was mildly dilated. - Pulmonary arteries: No complete TR doppler jet so unable to estimate PA systolic pressure. - Inferior vena cava: The vessel was normal in size; the respirophasic diameter changes were in the normal range (= 50%); findings are consistent with normal central venous pressure. Impressions:  - Normal LV size and systolic function, EF 55%. Bicuspid aortic valve with mild-moderate aortic insufficiency. Moderately dilated aortic root, mildly dilated ascending aorta.   No Known Allergies  Current Outpatient Prescriptions  Medication Sig Dispense Refill  . amLODipine (NORVASC) 2.5 MG tablet Take 3 tablets (7.5 mg total) by mouth  daily. 270 tablet 3  . Multiple Vitamin (MULTIVITAMIN) capsule Take 1 capsule by mouth daily.    . ramipril (ALTACE) 10 MG capsule Take 1 capsule (10 mg total) by mouth daily. 90 capsule 3   No current facility-administered medications for this visit.    Social History   Social History  . Marital Status: Married    Spouse Name: N/A  . Number of Children: N/A  . Years of Education: N/A   Occupational History  . Not on file.   Social History Main Topics  . Smoking status: Never Smoker   . Smokeless tobacco: Not on file  . Alcohol Use: Yes     Comment: very limited  . Drug Use: No  . Sexual Activity: Not on file   Other Topics Concern  . Not on file   Social History Narrative    Family History  Problem Relation Age of Onset  . Diabetes Mother   . Prostate cancer Father     Past Medical History  Diagnosis Date  . Prolonged Q-T interval on ECG 2010    467ms.Marland Kitchen.Marland Kitchen.July, 2011  . PVC (premature ventricular contraction)   . Palpitations 09/2008    Holter, one short burst SVT  2 am  . Beta-blocker intolerance     July, 2010, metoprolol  . Viral illness 07/2008    prolonged  . Low HDL (under 40)     LDL 110 09/2008  . Chest pain 09/2008     stress echo.  WUJW,1191July,2010.Marland Kitchen.normal...EF % 50-55%  . Bicuspid aortic valve 09/2008    Bicuspid aortic valve echo, July, 2010  //  cardiac CT angiogram October, 2011, bicuspid aortic valve  . Aortic insufficiency 09/2008    Mild,/ Moderate  echo, July, 2010  .  Dilated aortic root 09/2008    echo...44-18mm/july, 2011...echo..45-8mm  . Nephrolithiasis   . HTN (hypertension)     borderline  . S/P cholecystectomy   . Ejection fraction     EF 50-55%, echo, July, 2010  . Lung nodule     Triangular-shaped nodule along the minor fissure stable and likely subpleural lymph node, CT scan, October 16, 2010 no further CT scans needed by report  . CAD (coronary artery disease)     Calcium score of 11, single nidus in the proximal LAD, cardiac CT angiogram   October, 2011    Past Surgical History  Procedure Laterality Date  . Cholecystectomy    . Knee arthroscopy      x 4    Patient Active Problem List   Diagnosis Date Noted  . Anxiety 02/17/2014  . Lung nodule   . Prolonged Q-T interval on ECG   . PVC (premature ventricular contraction)   . Beta-blocker intolerance   . Low HDL (under 40)   . Nephrolithiasis   . HTN (hypertension)   . S/P cholecystectomy   . Ejection fraction   . CAD (coronary artery disease)   . Palpitations 09/30/2008  . Chest pain 09/30/2008  . Aortic insufficiency 09/30/2008  . Bicuspid aortic valve 09/30/2008  . Dilated aortic root (HCC) 09/30/2008  . Viral illness 07/31/2008    ROS  Patient denies fever, chills, headache, sweats, rash, change in vision, change in hearing, cough, nausea or vomiting, urinary symptoms. All other systems are reviewed and are negative.  PHYSICAL EXAM Patient is oriented to person time and place. Affect is normal. He appears visually anxious. His communication is normal. He has a resting tremor that he has had. Head is atraumatic. Sclera and conjunctiva are normal. There is no jugular venous distention. Lungs are clear. Respiratory effort is nonlabored. Cardiac exam reveals an S1 and S2. There are no clicks or significant murmurs. The abdomen is soft. There is no peripheral edema. There are no musculoskeletal deformities. There are no skin rashes.  There were no vitals filed for this visit. EKG is done today and reviewed by me. There is normal sinus rhythm. The EKG is normal.  ASSESSMENT & PLAN Bicuspid AV: Aortic Root Dilatation Anxiety: HTN:   Charlton Haws

## 2015-09-20 ENCOUNTER — Encounter: Payer: Self-pay | Admitting: Cardiovascular Disease

## 2015-10-17 ENCOUNTER — Other Ambulatory Visit: Payer: Self-pay | Admitting: Cardiology

## 2015-11-11 ENCOUNTER — Other Ambulatory Visit: Payer: Self-pay | Admitting: Cardiology

## 2015-11-15 ENCOUNTER — Other Ambulatory Visit: Payer: Self-pay | Admitting: *Deleted

## 2015-11-15 NOTE — Telephone Encounter (Signed)
Patient is wanting refill on Altace but has not been seen since 2015 and his AVS says that he should follow up as needed. Please advise.

## 2015-11-16 ENCOUNTER — Telehealth: Payer: Self-pay | Admitting: *Deleted

## 2015-11-16 MED ORDER — RAMIPRIL 10 MG PO CAPS: 10.0000 mg | ORAL_CAPSULE | Freq: Every day | ORAL | 0 refills | Status: DC

## 2015-11-16 NOTE — Telephone Encounter (Signed)
**Note De-Identified Garrison Obfuscation** Howard FormosaPatricia M Via, LPN routed conversation to Tenet HealthcareChasitie R Mack, CMA; Cv [facility] Refill 2 hours ago (8:03 AM)    Howard FormosaPatricia M Via, LPN 2 hours ago (8:00 AM)    Refer to PCP. Thanks.   Documentation     Chasitie York Cerise Mack, CMA routed conversation to Howard FormosaPatricia M Via, LPN 20 hours ago (2:31 PM)    Vernelle Emeraldhasitie R Mack, CMA 20 hours ago (2:30 PM)    Patient is wanting refill on Altace but has not been seen since 2015 and his AVS says that he should follow up as needed. Please advise.   Documentation

## 2015-11-16 NOTE — Telephone Encounter (Signed)
appt with Nishan, refilled meds

## 2015-11-16 NOTE — Telephone Encounter (Signed)
Refer to PCP. Thanks 

## 2015-11-20 NOTE — Progress Notes (Signed)
Patient ID: Howard Garrison, male   DOB: 07-Jun-1955, 60 y.o.   MRN: [id number]    HPI New patient to me Previously seen by Dr Howard Garrison  He is a very active and healthy 60 y.o.  who has known bicuspid aortic valve with mild aortic insufficiency. He has mild dilatation of the aortic root but no dilatation of the ascending aorta. Recently he has had significant problems with feeling anxious. He feels some palpitations. He has not had any prolonged palpitations. He feels a vague sensation of chest tightness. It is not exertional. It is constant when he is at rest.  Also history of HTN  Last echo 2013 with mild to moderate AR mean gradient 6 peak 15 mmHg Sinus measures 43 mm  Has been doing well has grand daughter in [place] that he spends a lot of time with No dyspnea chest pain or syncope  Occasional palpitations and PVC on ECG today   Do to lack of insurance for echo's we did quick look in office EF normal mild AR Root 4.2 cm and peak gradient only 14 mmHg  No Known Allergies  Current Outpatient Prescriptions  Medication Sig Dispense Refill  . amLODipine (NORVASC) 2.5 MG tablet Take 3 tablets (7.5 mg total) by mouth daily. Please keep 11/23/15 appointment for further refills 90 tablet 0  . LORazepam (ATIVAN) 1 MG tablet Take 2 mg by mouth daily.  4  . Multiple Vitamin (MULTIVITAMIN) capsule Take 1 capsule by mouth daily.    . ramipril (ALTACE) 10 MG capsule Take 1 capsule (10 mg total) by mouth daily. 30 capsule 0  . tadalafil (CIALIS) 20 MG tablet Take 20 mg by mouth daily as needed for erectile dysfunction.      No current facility-administered medications for this visit.     Social History   Social History  . Marital status: Married    Spouse name: N/A  . Number of children: N/A  . Years of education: N/A   Occupational History  . Not on file.   Social History Main Topics  . Smoking status: Never Smoker  . Smokeless tobacco: Never Used  . Alcohol use Yes     Comment: very  limited  . Drug use: No  . Sexual activity: Not on file   Other Topics Concern  . Not on file   Social History Narrative  . No narrative on file    Family History  Problem Relation Age of Onset  . Diabetes Mother   . Prostate cancer Father     Past Medical History:  Diagnosis Date  . Aortic insufficiency 09/2008   Mild,/ Moderate  echo, July, 2010  . Beta-blocker intolerance    July, 2010, metoprolol  . Bicuspid aortic valve 09/2008   Bicuspid aortic valve echo, July, 2010  //  cardiac CT angiogram October, 2011, bicuspid aortic valve  . CAD (coronary artery disease)    Calcium score of 11, single nidus in the proximal LAD, cardiac CT angiogram  October, 2011  . Chest pain 09/2008    stress echo.  WUJW,1191July,2010.Marland Kitchen.normal...EF % 50-55%  . Dilated aortic root (HCC) 09/2008   echo...44-5245mm/july, 2011...echo..45-5746mm  . Ejection fraction    EF 50-55%, echo, July, 2010  . HTN (hypertension)    borderline  . Low HDL (under 40)    LDL 110 4/78297/2010  . Lung nodule    Triangular-shaped nodule along the minor fissure stable and likely subpleural lymph node, CT scan, October 16, 2010 no further CT  scans needed by report  . Nephrolithiasis   . Palpitations 09/2008   Holter, one short burst SVT  2 am  . Prolonged Q-T interval on ECG 2010   467ms.Marland Kitchen.Marland Kitchen.July, 2011  . PVC (premature ventricular contraction)   . S/P cholecystectomy   . Viral illness 07/2008   prolonged    Past Surgical History:  Procedure Laterality Date  . CHOLECYSTECTOMY    . KNEE ARTHROSCOPY     x 4    Patient Active Problem List   Diagnosis Date Noted  . Anxiety 02/17/2014  . Lung nodule   . Prolonged Q-T interval on ECG   . PVC (premature ventricular contraction)   . Beta-blocker intolerance   . Low HDL (under 40)   . Nephrolithiasis   . HTN (hypertension)   . S/P cholecystectomy   . Ejection fraction   . CAD (coronary artery disease)   . Palpitations 09/30/2008  . Chest pain 09/30/2008  . Aortic insufficiency  09/30/2008  . Bicuspid aortic valve 09/30/2008  . Dilated aortic root (HCC) 09/30/2008  . Viral illness 07/31/2008    ROS  Patient denies fever, chills, headache, sweats, rash, change in vision, change in hearing, cough, nausea or vomiting, urinary symptoms. All other systems are reviewed and are negative.  PHYSICAL EXAM Affect appropriate Healthy:  appears stated age HEENT: normal Neck supple with no adenopathy JVP normal no bruits no thyromegaly Lungs clear with no wheezing and good diaphragmatic motion Heart:  S1/S2 AR  murmur, no rub, gallop or click PMI normal Abdomen: benighn, BS positve, no tenderness, no AAA no bruit.  No HSM or HJR Distal pulses intact with no bruits No edema Neuro non-focal Skin warm and dry No muscular weakness   Vitals:   11/23/15 0908  BP: 92/66  Pulse: (!) 59  SpO2: 97%  Weight: 194 lb 12.8 oz (88.4 kg)  Height: 6' 0.5" (1.842 m)     EKG  There is normal sinus rhythm. NSR first degree otherwise normal  PR 226 rate 48  ASSESSMENT & PLAN Bicuspid AV stable AR murmur mild no stenosis root 4.2 cm  HTN:  Well controlled.  Continue current medications and low sodium Dash type diet.   Anxiety: stable f/u primary  Palpitations  Benign no beta blocker given bradycardia and long PR   F/U with me in a year    [place]

## 2015-11-23 ENCOUNTER — Ambulatory Visit (INDEPENDENT_AMBULATORY_CARE_PROVIDER_SITE_OTHER): Payer: Self-pay | Admitting: Cardiovascular Disease

## 2015-11-23 ENCOUNTER — Encounter (INDEPENDENT_AMBULATORY_CARE_PROVIDER_SITE_OTHER): Payer: Self-pay

## 2015-11-23 ENCOUNTER — Encounter: Payer: Self-pay | Admitting: Cardiovascular Disease

## 2015-11-23 VITALS — BP 92/66 | HR 59 | Ht 72.5 in | Wt 194.8 lb

## 2015-11-23 DIAGNOSIS — R002 Palpitations: Secondary | ICD-10-CM

## 2015-11-23 DIAGNOSIS — Q231 Congenital insufficiency of aortic valve: Secondary | ICD-10-CM

## 2015-11-23 DIAGNOSIS — Z7689 Persons encountering health services in other specified circumstances: Secondary | ICD-10-CM

## 2015-11-23 DIAGNOSIS — Z7189 Other specified counseling: Secondary | ICD-10-CM

## 2015-11-23 NOTE — Patient Instructions (Addendum)

## 2015-12-14 ENCOUNTER — Other Ambulatory Visit: Payer: Self-pay | Admitting: Cardiovascular Disease

## 2015-12-14 ENCOUNTER — Other Ambulatory Visit: Payer: Self-pay | Admitting: Cardiology

## 2016-09-14 ENCOUNTER — Other Ambulatory Visit: Payer: Self-pay | Admitting: Cardiovascular Disease

## 2016-10-07 ENCOUNTER — Other Ambulatory Visit: Payer: Self-pay | Admitting: Cardiovascular Disease

## 2016-10-16 ENCOUNTER — Telehealth: Payer: Self-pay | Admitting: Cardiovascular Disease

## 2016-10-16 MED ORDER — AMLODIPINE BESYLATE 2.5 MG PO TABS: 7.5000 mg | ORAL_TABLET | Freq: Every day | ORAL | 0 refills | Status: DC

## 2016-10-16 NOTE — Telephone Encounter (Signed)
New Message   *STAT* If patient is at the pharmacy, call can be transferred to refill team.   1. Which medications need to be refilled? (please list name of each medication and dose if known) amlodipine 2.5mg    2. Which pharmacy/location (including street and city if local pharmacy) is [facility] [place]  3. Do they need a 30 day or 90 day supply? 90

## 2016-10-16 NOTE — Telephone Encounter (Signed)
Pt's medication was sent to pt's pharmacy as requested. Confirmation received.  °

## 2016-11-04 ENCOUNTER — Other Ambulatory Visit: Payer: Self-pay | Admitting: Cardiovascular Disease

## 2016-11-19 NOTE — Progress Notes (Deleted)
Patient ID: Howard Garrison, male   DOB: Dec 21, 1955, 61 y.o.   MRN: [id number]    HPI Previously seen by Dr Myrtis Ser  He is a very active and healthy 61 y.o.  who has known bicuspid aortic valve with mild aortic insufficiency. He has mild dilatation of the aortic root but no dilatation of the ascending aorta. Recently he has had significant problems with feeling anxious. He feels some palpitations. He has not had any prolonged palpitations. He feels a vague sensation of chest tightness. It is not exertional. It is constant when he is at rest.  Also history of HTN  Last echo 2013 with mild to moderate AR mean gradient 6 peak 15 mmHg Sinus measures 43 mm  Has been doing well has grand daughter in [place] that he spends a lot of time with No dyspnea chest pain or syncope  Occasional palpitations and PVC   Do to lack of insurance for echo's we did quick look in office EF normal mild AR Root 4.2 cm and peak gradient only 14 mmHg  No Known Allergies  Current Outpatient Prescriptions  Medication Sig Dispense Refill  . amLODipine (NORVASC) 2.5 MG tablet Take 3 tablets (7.5 mg total) by mouth daily. 270 tablet 0  . LORazepam (ATIVAN) 1 MG tablet Take 2 mg by mouth daily.  4  . Multiple Vitamin (MULTIVITAMIN) capsule Take 1 capsule by mouth daily.    . ramipril (ALTACE) 10 MG capsule TAKE ONE CAPSULE BY MOUTH ONE TIME DAILY  30 capsule 0  . tadalafil (CIALIS) 20 MG tablet Take 20 mg by mouth daily as needed for erectile dysfunction.      No current facility-administered medications for this visit.     Social History   Social History  . Marital status: Married    Spouse name: N/A  . Number of children: N/A  . Years of education: N/A   Occupational History  . Not on file.   Social History Main Topics  . Smoking status: Never Smoker  . Smokeless tobacco: Never Used  . Alcohol use Yes     Comment: very limited  . Drug use: No  . Sexual activity: Not on file   Other Topics Concern    . Not on file   Social History Narrative  . No narrative on file    Family History  Problem Relation Age of Onset  . Diabetes Mother   . Prostate cancer Father     Past Medical History:  Diagnosis Date  . Aortic insufficiency 09/2008   Mild,/ Moderate  echo, July, 2010  . Beta-blocker intolerance    July, 2010, metoprolol  . Bicuspid aortic valve 09/2008   Bicuspid aortic valve echo, July, 2010  //  cardiac CT angiogram October, 2011, bicuspid aortic valve  . CAD (coronary artery disease)    Calcium score of 11, single nidus in the proximal LAD, cardiac CT angiogram  October, 2011  . Chest pain 09/2008    stress echo.  WUJW,1191.Marland Kitchennormal...EF % 50-55%  . Dilated aortic root (HCC) 09/2008   echo...44-64mm/july, 2011...echo..45-39mm  . Ejection fraction    EF 50-55%, echo, July, 2010  . HTN (hypertension)    borderline  . Low HDL (under 40)    LDL 110 07/7827  . Lung nodule    Triangular-shaped nodule along the minor fissure stable and likely subpleural lymph node, CT scan, October 16, 2010 no further CT scans needed by report  . Nephrolithiasis   . Palpitations 09/2008  Holter, one short burst SVT  2 am  . Prolonged Q-T interval on ECG 2010   .Marland KitchenMarland KitchenJuly, 2011  . PVC (premature ventricular contraction)   . S/P cholecystectomy   . Viral illness 07/2008   prolonged    Past Surgical History:  Procedure Laterality Date  . CHOLECYSTECTOMY    . KNEE ARTHROSCOPY     x 4    Patient Active Problem List   Diagnosis Date Noted  . Anxiety 02/17/2014  . Lung nodule   . Prolonged Q-T interval on ECG   . PVC (premature ventricular contraction)   . Beta-blocker intolerance   . Low HDL (under 40)   . Nephrolithiasis   . HTN (hypertension)   . S/P cholecystectomy   . Ejection fraction   . CAD (coronary artery disease)   . Palpitations 09/30/2008  . Chest pain 09/30/2008  . Aortic insufficiency 09/30/2008  . Bicuspid aortic valve 09/30/2008  . Dilated aortic root (HCC)  09/30/2008  . Viral illness 07/31/2008    ROS  Patient denies fever, chills, headache, sweats, rash, change in vision, change in hearing, cough, nausea or vomiting, urinary symptoms. All other systems are reviewed and are negative.  PHYSICAL EXAM Affect appropriate Healthy:  appears stated age HEENT: normal Neck supple with no adenopathy JVP normal no bruits no thyromegaly Lungs clear with no wheezing and good diaphragmatic motion Heart:  S1/S2 AR  murmur, no rub, gallop or click PMI normal Abdomen: benighn, BS positve, no tenderness, no AAA no bruit.  No HSM or HJR Distal pulses intact with no bruits No edema Neuro non-focal Skin warm and dry No muscular weakness   There were no vitals filed for this visit.   EKG  There is normal sinus rhythm. NSR first degree otherwise normal  PR 226 rate 48  ASSESSMENT & PLAN Bicuspid AV stable AR murmur mild no stenosis root 4.2 cm f/u echo ordered  HTN:  Well controlled.  Continue current medications and low sodium Dash type diet.   Anxiety: stable f/u primary  Palpitations  Benign no beta blocker given bradycardia and long PR   F/U with me in a year    Charlton Haws

## 2016-11-22 ENCOUNTER — Ambulatory Visit: Payer: Self-pay | Admitting: Cardiovascular Disease

## 2016-12-09 ENCOUNTER — Other Ambulatory Visit: Payer: Self-pay | Admitting: Cardiovascular Disease

## 2017-02-07 ENCOUNTER — Other Ambulatory Visit: Payer: Self-pay | Admitting: Cardiovascular Disease

## 2017-02-08 ENCOUNTER — Other Ambulatory Visit: Payer: Self-pay | Admitting: Cardiovascular Disease

## 2017-02-08 NOTE — Telephone Encounter (Signed)
Medication Detail    Disp Refills Start End   amLODipine (NORVASC) 2.5 MG tablet 90 tablet 0 02/07/2017    Sig: Take 3 tablets by mouth daily. Please keep upcoming appt for future refills. Thank you   Sent to pharmacy as: amLODipine (NORVASC) 2.5 MG tablet   E-Prescribing Status: Receipt confirmed by pharmacy (02/07/2017 3:28 PM EST)   Pharmacy   [id number] - [name],  - [name] [facility] [address]

## 2017-02-12 NOTE — Progress Notes (Signed)
Patient ID: Howard Garrison, male   DOB: 12/17/1955, 61 y.o.   MRN: [id number]    HPI 61 y.o.  who has known bicuspid aortic valve with mild aortic insufficiency. He has mild dilatation of the aortic root but no dilatation of the ascending aorta. History of palpitations and HTN  Last echo 2013 with mild to moderate AR mean gradient 6 peak 15 mmHg Sinus measures 43 mm  Has been doing well has grand daughter in [place] that he spends a lot of time with No dyspnea chest pain or syncope  Occasional palpitations and PVC   Do to lack of insurance for echo's we did a quick look no charge Echo August 2017  EF normal mild AR Root 4.2 cm and peak gradient only 14 mmHg  Labs from primary reviewed 01/26/17 normal TC 149 LDL 98   Still commuting from [place] and [place] but living in [place] To help raise 3 yo grand daughter   No Known Allergies  Current Outpatient Medications  Medication Sig Dispense Refill  . amLODipine (NORVASC) 5 MG tablet Take 3 tablets by mouth daily. Please keep upcoming appt for future refills. Thank you 90 tablet 3  . busPIRone (BUSPAR) 15 MG tablet TAKE 1/3 TABLET BY MOUTH TWO TIMES A DAY FOR 7 DAYS , THEN 2/3 TABLET TWO TIMES A DAY FOR 7 DAYS , THEN TAKE ONE TABLET BY MOUTH TWICE A DAY  5  . LORazepam (ATIVAN) 1 MG tablet Take 2 mg by mouth daily.  4  . Multiple Vitamin (MULTIVITAMIN) capsule Take 1 capsule by mouth daily.    . ramipril (ALTACE) 10 MG capsule Take 1 capsule (10 mg total) by mouth daily. Patient must keep 02/18/17 appointment for further refills 90 capsule 0  . tadalafil (CIALIS) 20 MG tablet Take 20 mg by mouth daily as needed for erectile dysfunction.      No current facility-administered medications for this visit.     Social History   Socioeconomic History  . Marital status: Married    Spouse name: Not on file  . Number of children: Not on file  . Years of education: Not on file  . Highest education level: Not  on file  Social Needs  . Financial resource strain: Not on file  . Food insecurity - worry: Not on file  . Food insecurity - inability: Not on file  . Transportation needs - medical: Not on file  . Transportation needs - non-medical: Not on file  Occupational History  . Not on file  Tobacco Use  . Smoking status: Never Smoker  . Smokeless tobacco: Never Used  Substance and Sexual Activity  . Alcohol use: Yes    Comment: very limited  . Drug use: No  . Sexual activity: Not on file  Other Topics Concern  . Not on file  Social History Narrative  . Not on file    Family History  Problem Relation Age of Onset  . Diabetes Mother   . Prostate cancer Father     Past Medical History:  Diagnosis Date  . Aortic insufficiency 09/2008   Mild,/ Moderate  echo, July, 2010  . Beta-blocker intolerance    July, 2010, metoprolol  . Bicuspid aortic valve 09/2008   Bicuspid aortic valve echo, July, 2010  //  cardiac CT angiogram October, 2011, bicuspid aortic valve  . CAD (coronary artery disease)    Calcium score of 11, single nidus in the proximal LAD, cardiac CT angiogram  October, 2011  . Chest pain 09/2008    stress echo.  JXBJ,4782July,2010.Marland Kitchen.normal...EF % 50-55%  . Dilated aortic root (HCC) 09/2008   echo...44-2445mm/july, 2011...echo..45-5246mm  . Ejection fraction    EF 50-55%, echo, July, 2010  . HTN (hypertension)    borderline  . Low HDL (under 40)    LDL 110 9/56217/2010  . Lung nodule    Triangular-shaped nodule along the minor fissure stable and likely subpleural lymph node, CT scan, October 16, 2010 no further CT scans needed by report  . Nephrolithiasis   . Palpitations 09/2008   Holter, one short burst SVT  2 am  . Prolonged Q-T interval on ECG 2010   467ms.Marland Kitchen.Marland Kitchen.July, 2011  . PVC (premature ventricular contraction)   . S/P cholecystectomy   . Viral illness 07/2008   prolonged    Past Surgical History:  Procedure Laterality Date  . CHOLECYSTECTOMY    . KNEE ARTHROSCOPY     x 4     Patient Active Problem List   Diagnosis Date Noted  . Anxiety 02/17/2014  . Lung nodule   . Prolonged Q-T interval on ECG   . PVC (premature ventricular contraction)   . Beta-blocker intolerance   . Low HDL (under 40)   . Nephrolithiasis   . HTN (hypertension)   . S/P cholecystectomy   . Ejection fraction   . CAD (coronary artery disease)   . Palpitations 09/30/2008  . Chest pain 09/30/2008  . Aortic insufficiency 09/30/2008  . Bicuspid aortic valve 09/30/2008  . Dilated aortic root (HCC) 09/30/2008  . Viral illness 07/31/2008    ROS  Patient denies fever, chills, headache, sweats, rash, change in vision, change in hearing, cough, nausea or vomiting, urinary symptoms. All other systems are reviewed and are negative.  PHYSICAL EXAM Affect appropriate Healthy:  appears stated age HEENT: normal Neck supple with no adenopathy JVP normal no bruits no thyromegaly Lungs clear with no wheezing and good diaphragmatic motion Heart:  S1/S2 SEM / AR  murmur, no rub, gallop or click PMI normal Abdomen: benighn, BS positve, no tenderness, no AAA no bruit.  No HSM or HJR Distal pulses intact with no bruits No edema Neuro non-focal Skin warm and dry No muscular weakness    Vitals:   02/18/17 0923  BP: 98/70  Pulse: (!) 58  SpO2: 96%  Weight: 206 lb 8 oz (93.7 kg)  Height: 6\' 1"  (1.854 m)     EKG  There is normal sinus rhythm. NSR first degree otherwise normal  PR 226 rate 48 02/18/17 SR rate 55 PR 212 nonspecific ST changes   ASSESSMENT & PLAN Bicuspid AV mean gradient 14 mmhg 11/2015 mild AR consider echo in a year  HTN:  Well controlled.  Continue current medications and low sodium Dash type diet.   Decrease norvasc to 5 mg daily  Anxiety: stable f/u primary  Palpitations  Benign no beta blocker given bradycardia and long PR  Aorta:  Ascending root 4.2 cm echo August 2017 consider f/u CTA if he is willing to spend money in a year   F/U with me in a year     Charlton Hawseter Nishan

## 2017-02-18 ENCOUNTER — Ambulatory Visit (INDEPENDENT_AMBULATORY_CARE_PROVIDER_SITE_OTHER): Payer: Self-pay | Admitting: Cardiovascular Disease

## 2017-02-18 ENCOUNTER — Encounter: Payer: Self-pay | Admitting: Cardiovascular Disease

## 2017-02-18 VITALS — BP 98/70 | HR 58 | Ht 73.0 in | Wt 206.5 lb

## 2017-02-18 DIAGNOSIS — Q231 Congenital insufficiency of aortic valve: Secondary | ICD-10-CM

## 2017-02-18 DIAGNOSIS — I10 Essential (primary) hypertension: Secondary | ICD-10-CM

## 2017-02-18 MED ORDER — AMLODIPINE BESYLATE 5 MG PO TABS: ORAL_TABLET | ORAL | 3 refills | Status: DC

## 2017-02-18 MED ORDER — RAMIPRIL 10 MG PO CAPS: 10.0000 mg | ORAL_CAPSULE | Freq: Every day | ORAL | 3 refills | Status: DC

## 2017-02-18 NOTE — Patient Instructions (Signed)
Medication Instructions:  Your physician has recommended you make the following change in your medication:  1- Change Norvasc 5 mg by mouth daily  Labwork: NONE  Testing/Procedures: NONE  Follow-Up: Your physician wants you to follow-up in: 12 months with Dr. Eden EmmsNishan. You will receive a reminder letter in the mail two months in advance. If you don't receive a letter, please call our office to schedule the follow-up appointment.   If you need a refill on your cardiac medications before your next appointment, please call your pharmacy.

## 2017-03-15 ENCOUNTER — Telehealth: Payer: Self-pay | Admitting: Cardiovascular Disease

## 2017-03-15 MED ORDER — AMLODIPINE BESYLATE 5 MG PO TABS: 5.0000 mg | ORAL_TABLET | Freq: Every day | ORAL | 3 refills | Status: DC

## 2017-03-15 NOTE — Telephone Encounter (Signed)
New Message  Pt c/o medication issue:  1. Name of Medication: Amlodipine   2. How are you currently taking this medication (dosage and times per day)? 5mg    3. Are you having a reaction (difficulty breathing--STAT)? no  4. What is your medication issue? Is the pts supply a 30 or 90 day

## 2017-03-15 NOTE — Telephone Encounter (Signed)
Sent in prescription for Amlodipine 5 mg by mouth daily as instructed at office visit.

## 2017-03-15 NOTE — Telephone Encounter (Signed)
Please advise on how patient should be taking this medication. At his last office visit it was ordered at three tablets qd but it is documented that he is to reduce his dose to one tablet qd. Thanks, MI

## 2017-03-15 NOTE — Telephone Encounter (Signed)
Patient actually did not call the office, [facility]. I spoke with pharmacist and she stated that they were trying to clarify patients current amlodipine dose as the patients wife had presented a printed rx for amlodipine 5 mg with a sig of three tabs qd, but patient informed them that was not his correct dose. I made her aware that the patients dose was reduced to 5 mg qd by Dr Eden EmmsNishan at his 02/18/17 office visit. She will update this in their system and refill the medication accordingly.

## 2017-12-07 ENCOUNTER — Other Ambulatory Visit: Payer: Self-pay | Admitting: Cardiovascular Disease

## 2018-03-06 ENCOUNTER — Other Ambulatory Visit: Payer: Self-pay | Admitting: Cardiovascular Disease

## 2018-03-06 MED ORDER — AMLODIPINE BESYLATE 5 MG PO TABS: 5.0000 mg | ORAL_TABLET | Freq: Every day | ORAL | 0 refills | Status: DC

## 2018-03-06 MED ORDER — RAMIPRIL 10 MG PO CAPS: 10.0000 mg | ORAL_CAPSULE | Freq: Every day | ORAL | 0 refills | Status: DC

## 2018-03-18 ENCOUNTER — Other Ambulatory Visit: Payer: Self-pay | Admitting: Cardiovascular Disease

## 2018-03-21 ENCOUNTER — Other Ambulatory Visit: Payer: Self-pay | Admitting: Psychiatry

## 2018-03-21 ENCOUNTER — Other Ambulatory Visit: Payer: Self-pay | Admitting: Cardiovascular Disease

## 2018-05-09 ENCOUNTER — Other Ambulatory Visit: Payer: Self-pay | Admitting: Psychiatry

## 2018-05-09 NOTE — Telephone Encounter (Signed)
Need to review paper chart  

## 2018-05-21 NOTE — Progress Notes (Signed)
Patient ID: Howard Garrison, male   DOB: 1955/06/09, 63 y.o.   MRN: [id number]    HPI 63 y.o.  who has known bicuspid aortic valve with mild aortic insufficiency. He has mild dilatation of the aortic root History of palpitations and HTN  Occasional palpitations and PVC   Do to lack of insurance for echo's we did a quick look no charge Echo August 2017 root 4.2 cm peak gradient 14 mmHg  Still commuting from [place] to work in [place] and [place] but living in [place] To help raise 3 yo grand daughter   Has medicare now so can order TTE   No Known Allergies  Current Outpatient Medications  Medication Sig Dispense Refill  . amLODipine (NORVASC) 5 MG tablet Take 1 tablet (5 mg total) by mouth daily. Please make overdue yearly appt with Dr. Eden Emms before anymore refills. 2nd attempt 90 tablet 0  . escitalopram (LEXAPRO) 5 MG tablet TAKE ONE TABLET BY MOUTH EVERY EVENING 90 tablet 2  . LORazepam (ATIVAN) 1 MG tablet TAKE ONE TABLET BY MOUTH THREE TIMES A DAY 90 tablet 3  . Multiple Vitamin (MULTIVITAMIN) capsule Take 1 capsule by mouth daily.    . ramipril (ALTACE) 10 MG capsule Take 1 capsule (10 mg total) by mouth daily. Please keep upcoming appt in February with Dr. Eden Emms for future refills. Thank you 90 capsule 0  . tadalafil (CIALIS) 20 MG tablet Take 20 mg by mouth daily as needed for erectile dysfunction.      No current facility-administered medications for this visit.     Social History   Socioeconomic History  . Marital status: Married    Spouse name: Not on file  . Number of children: Not on file  . Years of education: Not on file  . Highest education level: Not on file  Occupational History  . Not on file  Social Needs  . Financial resource strain: Not on file  . Food insecurity:    Worry: Not on file    Inability: Not on file  . Transportation needs:    Medical: Not on file    Non-medical: Not on file  Tobacco Use  . Smoking status: Never Smoker  .  Smokeless tobacco: Never Used  Substance and Sexual Activity  . Alcohol use: Yes    Comment: very limited  . Drug use: No  . Sexual activity: Not on file  Lifestyle  . Physical activity:    Days per week: Not on file    Minutes per session: Not on file  . Stress: Not on file  Relationships  . Social connections:    Talks on phone: Not on file    Gets together: Not on file    Attends religious service: Not on file    Active member of club or organization: Not on file    Attends meetings of clubs or organizations: Not on file    Relationship status: Not on file  . Intimate partner violence:    Fear of current or ex partner: Not on file    Emotionally abused: Not on file    Physically abused: Not on file    Forced sexual activity: Not on file  Other Topics Concern  . Not on file  Social History Narrative  . Not on file    Family History  Problem Relation Age of Onset  . Diabetes Mother   . Prostate cancer Father     Past Medical History:  Diagnosis Date  .  Aortic insufficiency 09/2008   Mild,/ Moderate  echo, July, 2010  . Beta-blocker intolerance    July, 2010, metoprolol  . Bicuspid aortic valve 09/2008   Bicuspid aortic valve echo, July, 2010  //  cardiac CT angiogram October, 2011, bicuspid aortic valve  . CAD (coronary artery disease)    Calcium score of 11, single nidus in the proximal LAD, cardiac CT angiogram  October, 2011  . Chest pain 09/2008    stress echo.  OINO,6767.Marland Kitchennormal...EF % 50-55%  . Dilated aortic root (HCC) 09/2008   echo...44-20mm/july, 2011...echo..45-36mm  . Ejection fraction    EF 50-55%, echo, July, 2010  . HTN (hypertension)    borderline  . Low HDL (under 40)    LDL 110 05/945  . Lung nodule    Triangular-shaped nodule along the minor fissure stable and likely subpleural lymph node, CT scan, October 16, 2010 no further CT scans needed by report  . Nephrolithiasis   . Palpitations 09/2008   Holter, one short burst SVT  2 am  . Prolonged  Q-T interval on ECG 2010   .Marland KitchenMarland KitchenJuly, 2011  . PVC (premature ventricular contraction)   . S/P cholecystectomy   . Viral illness 07/2008   prolonged    Past Surgical History:  Procedure Laterality Date  . CHOLECYSTECTOMY    . KNEE ARTHROSCOPY     x 4    Patient Active Problem List   Diagnosis Date Noted  . Anxiety 02/17/2014  . Lung nodule   . Prolonged Q-T interval on ECG   . PVC (premature ventricular contraction)   . Beta-blocker intolerance   . Low HDL (under 40)   . Nephrolithiasis   . HTN (hypertension)   . S/P cholecystectomy   . Ejection fraction   . CAD (coronary artery disease)   . Palpitations 09/30/2008  . Chest pain 09/30/2008  . Aortic insufficiency 09/30/2008  . Bicuspid aortic valve 09/30/2008  . Dilated aortic root (HCC) 09/30/2008  . Viral illness 07/31/2008    ROS  Patient denies fever, chills, headache, sweats, rash, change in vision, change in hearing, cough, nausea or vomiting, urinary symptoms. All other systems are reviewed and are negative.  PHYSICAL EXAM Affect appropriate Healthy:  appears stated age HEENT: normal Neck supple with no adenopathy JVP normal no bruits no thyromegaly Lungs clear with no wheezing and good diaphragmatic motion Heart:  S1/S2 SEM / AR  murmur, no rub, gallop or click PMI normal Abdomen: benighn, BS positve, no tenderness, no AAA no bruit.  No HSM or HJR Distal pulses intact with no bruits No edema Neuro non-focal Skin warm and dry No muscular weakness    Vitals:   05/26/18 0924  BP: 110/64  Pulse: (!) 57  SpO2: 94%  Weight: 93.5 kg  Height: 6\' 1"  (1.854 m)     EKG  05/26/18 SR rate 57 PR 218 nonspecific ST changes   ASSESSMENT & PLAN Bicuspid AV mean gradient 14 mmhg 11/2015 mild AR f/u TTE ordered  HTN:  Well controlled.  Continue current medications and low sodium Dash type diet.   Decrease norvasc to 5 mg daily  Anxiety: stable f/u primary  Palpitations  Benign no beta blocker given  bradycardia and long PR  Aorta:  Ascending root 4.2 cm echo August 2017 CTA if TTE shows  Dilatation of arota   F/U with me in a year    Charlton Haws

## 2018-05-26 ENCOUNTER — Ambulatory Visit: Payer: Medicare HMO | Admitting: Cardiovascular Disease

## 2018-05-26 ENCOUNTER — Encounter: Payer: Self-pay | Admitting: Cardiovascular Disease

## 2018-05-26 VITALS — BP 110/64 | HR 57 | Ht 73.0 in | Wt 206.1 lb

## 2018-05-26 DIAGNOSIS — I350 Nonrheumatic aortic (valve) stenosis: Secondary | ICD-10-CM | POA: Diagnosis not present

## 2018-05-26 DIAGNOSIS — Q231 Congenital insufficiency of aortic valve: Secondary | ICD-10-CM

## 2018-05-26 NOTE — Patient Instructions (Signed)
Medication Instructions:   If you need a refill on your cardiac medications before your next appointment, please call your pharmacy.   Lab work:  If you have labs (blood work) drawn today and your tests are completely normal, you will receive your results only by: . MyChart Message (if you have MyChart) OR . A paper copy in the mail If you have any lab test that is abnormal or we need to change your treatment, we will call you to review the results.  Testing/Procedures: Your physician has requested that you have an echocardiogram. Echocardiography is a painless test that uses sound waves to create images of your heart. It provides your doctor with information about the size and shape of your heart and how well your heart's chambers and valves are working. This procedure takes approximately one hour. There are no restrictions for this procedure.  Follow-Up: At [facility], you and your health needs are our priority.  As part of our continuing mission to provide you with exceptional heart care, we have created designated Provider Care Teams.  These Care Teams include your primary Cardiologist (physician) and Advanced Practice Providers (APPs -  Physician Assistants and Nurse Practitioners) who all work together to provide you with the care you need, when you need it. You will need a follow up appointment in 1 years.  Please call our office 2 months in advance to schedule this appointment.  You may see Dr. Nishan or one of the following Advanced Practice Providers on your designated Care Team:   Lori Gerhardt, NP Laura Ingold, NP . Jill McDaniel, NP    

## 2018-05-30 DIAGNOSIS — R69 Illness, unspecified: Secondary | ICD-10-CM | POA: Diagnosis not present

## 2018-05-30 DIAGNOSIS — I2510 Atherosclerotic heart disease of native coronary artery without angina pectoris: Secondary | ICD-10-CM | POA: Diagnosis not present

## 2018-05-30 DIAGNOSIS — R9430 Abnormal result of cardiovascular function study, unspecified: Secondary | ICD-10-CM | POA: Diagnosis not present

## 2018-05-30 DIAGNOSIS — N5201 Erectile dysfunction due to arterial insufficiency: Secondary | ICD-10-CM | POA: Diagnosis not present

## 2018-05-30 DIAGNOSIS — E786 Lipoprotein deficiency: Secondary | ICD-10-CM | POA: Diagnosis not present

## 2018-05-30 DIAGNOSIS — Z125 Encounter for screening for malignant neoplasm of prostate: Secondary | ICD-10-CM | POA: Diagnosis not present

## 2018-05-30 DIAGNOSIS — I77810 Thoracic aortic ectasia: Secondary | ICD-10-CM | POA: Diagnosis not present

## 2018-05-30 DIAGNOSIS — Z79899 Other long term (current) drug therapy: Secondary | ICD-10-CM | POA: Diagnosis not present

## 2018-05-30 DIAGNOSIS — Z1211 Encounter for screening for malignant neoplasm of colon: Secondary | ICD-10-CM | POA: Diagnosis not present

## 2018-05-30 DIAGNOSIS — I10 Essential (primary) hypertension: Secondary | ICD-10-CM | POA: Diagnosis not present

## 2018-06-01 ENCOUNTER — Other Ambulatory Visit: Payer: Self-pay | Admitting: Cardiovascular Disease

## 2018-06-02 DIAGNOSIS — R69 Illness, unspecified: Secondary | ICD-10-CM | POA: Diagnosis not present

## 2018-06-04 ENCOUNTER — Telehealth: Payer: Self-pay

## 2018-06-04 ENCOUNTER — Ambulatory Visit (HOSPITAL_COMMUNITY): Payer: Medicare HMO | Attending: Cardiology

## 2018-06-04 DIAGNOSIS — I350 Nonrheumatic aortic (valve) stenosis: Secondary | ICD-10-CM

## 2018-06-04 DIAGNOSIS — Q231 Congenital insufficiency of aortic valve: Secondary | ICD-10-CM | POA: Diagnosis not present

## 2018-06-04 NOTE — Telephone Encounter (Signed)
That's fine may help with AS as well

## 2018-06-04 NOTE — Telephone Encounter (Signed)
Patient aware of results. Per Dr. Eden Emms, AS remains mild good can do f/u echo in 2 years. Patient verbalized understanding. Patient also wanted Dr. Eden Emms to be aware of his PCP starting him on Lipitor 10 mg by mouth daily for elevated LDL 112 and HDL 35. Will update patient's medication list.

## 2018-06-04 NOTE — Telephone Encounter (Signed)
-----   Message from Wendall Stade, MD sent at 06/04/2018  1:31 PM EST ----- AS remains mild good can do f/u echo in 2 years

## 2018-06-04 NOTE — Telephone Encounter (Signed)
Called patient with Dr. Nishan's recommendations. Patient verbalized understanding.  

## 2018-06-12 DIAGNOSIS — I351 Nonrheumatic aortic (valve) insufficiency: Secondary | ICD-10-CM

## 2018-06-15 NOTE — Telephone Encounter (Signed)
He is not close to needing AVR based on gradients and stenosis but needs a CTA chest to size aorta some people need surgery sooner due to size of aorta/ aneurysm

## 2018-06-16 NOTE — Telephone Encounter (Signed)
Placed order for CTA chest to size aorta. Will send message to [facility] to call patient and schedule.

## 2018-06-17 ENCOUNTER — Other Ambulatory Visit: Payer: Self-pay

## 2018-06-17 DIAGNOSIS — Z01812 Encounter for preprocedural laboratory examination: Secondary | ICD-10-CM

## 2018-06-17 NOTE — Progress Notes (Signed)
BMET for CT

## 2018-06-19 ENCOUNTER — Other Ambulatory Visit: Payer: Self-pay | Admitting: Cardiovascular Disease

## 2018-08-08 DIAGNOSIS — N5201 Erectile dysfunction due to arterial insufficiency: Secondary | ICD-10-CM | POA: Diagnosis not present

## 2018-08-08 DIAGNOSIS — N401 Enlarged prostate with lower urinary tract symptoms: Secondary | ICD-10-CM | POA: Diagnosis not present

## 2018-08-26 ENCOUNTER — Other Ambulatory Visit: Payer: Medicare HMO

## 2018-08-26 ENCOUNTER — Other Ambulatory Visit: Payer: Self-pay

## 2018-08-26 DIAGNOSIS — Z01812 Encounter for preprocedural laboratory examination: Secondary | ICD-10-CM

## 2018-08-26 LAB — BASIC METABOLIC PANEL
BUN/Creatinine Ratio: 17 (ref 10–24)
BUN: 20 mg/dL (ref 8–27)
CO2: 27 mmol/L (ref 20–29)
Calcium: 9.2 mg/dL (ref 8.6–10.2)
Chloride: 105 mmol/L (ref 96–106)
Creatinine, Ser: 1.18 mg/dL (ref 0.76–1.27)
GFR calc Af Amer: 74 mL/min/{1.73_m2} (ref >59)
GFR calc non Af Amer: 64 mL/min/{1.73_m2} (ref >59)
Glucose: 87 mg/dL (ref 65–99)
Potassium: 4.3 mmol/L (ref 3.5–5.2)
Sodium: 139 mmol/L (ref 134–144)

## 2018-08-27 ENCOUNTER — Other Ambulatory Visit: Payer: Self-pay

## 2018-08-27 ENCOUNTER — Ambulatory Visit (INDEPENDENT_AMBULATORY_CARE_PROVIDER_SITE_OTHER)
Admission: RE | Admit: 2018-08-27 | Discharge: 2018-08-27 | Disposition: A | Discharge status: Home / Self Care | Payer: Medicare HMO | Source: Ambulatory Visit | Attending: Cardiovascular Disease | Admitting: Cardiovascular Disease

## 2018-08-27 DIAGNOSIS — I351 Nonrheumatic aortic (valve) insufficiency: Secondary | ICD-10-CM | POA: Diagnosis not present

## 2018-08-27 DIAGNOSIS — I719 Aortic aneurysm of unspecified site, without rupture: Secondary | ICD-10-CM | POA: Diagnosis not present

## 2018-08-27 MED ORDER — IOHEXOL 350 MG/ML SOLN
100.0000 mL | Freq: Once | INTRAVENOUS | Status: AC | PRN
  Administered 2018-08-27: 100 mL via INTRAVENOUS

## 2018-09-03 ENCOUNTER — Other Ambulatory Visit: Payer: Self-pay | Admitting: Psychiatry

## 2018-09-03 NOTE — Telephone Encounter (Signed)
Howard Garrison called to say that he is out as of today, so the request for the refill of the lorazepam is coming from a different pharmacy.  Please refill at the pharmacy on the request.  His appts is 12/04/18.  He wants to know if this could be a 90 day prescription vs just 30.

## 2018-09-04 NOTE — Telephone Encounter (Signed)
Is a 90 day supply ok for him?

## 2018-11-21 DIAGNOSIS — I10 Essential (primary) hypertension: Secondary | ICD-10-CM | POA: Diagnosis not present

## 2018-11-21 DIAGNOSIS — Z0000 Encounter for general adult medical examination without abnormal findings: Secondary | ICD-10-CM | POA: Diagnosis not present

## 2018-11-21 DIAGNOSIS — R69 Illness, unspecified: Secondary | ICD-10-CM | POA: Diagnosis not present

## 2018-11-21 DIAGNOSIS — E559 Vitamin D deficiency, unspecified: Secondary | ICD-10-CM | POA: Diagnosis not present

## 2018-11-21 DIAGNOSIS — R252 Cramp and spasm: Secondary | ICD-10-CM | POA: Diagnosis not present

## 2018-11-21 DIAGNOSIS — R7301 Impaired fasting glucose: Secondary | ICD-10-CM | POA: Diagnosis not present

## 2018-11-21 DIAGNOSIS — E785 Hyperlipidemia, unspecified: Secondary | ICD-10-CM | POA: Diagnosis not present

## 2018-11-21 DIAGNOSIS — I77810 Thoracic aortic ectasia: Secondary | ICD-10-CM | POA: Diagnosis not present

## 2018-11-21 DIAGNOSIS — N5201 Erectile dysfunction due to arterial insufficiency: Secondary | ICD-10-CM | POA: Diagnosis not present

## 2018-11-21 DIAGNOSIS — E786 Lipoprotein deficiency: Secondary | ICD-10-CM | POA: Diagnosis not present

## 2018-12-04 ENCOUNTER — Other Ambulatory Visit: Payer: Self-pay

## 2018-12-04 ENCOUNTER — Encounter: Payer: Self-pay | Admitting: Psychiatry

## 2018-12-04 ENCOUNTER — Encounter: Payer: Medicare HMO | Admitting: Psychiatry

## 2018-12-15 ENCOUNTER — Other Ambulatory Visit: Payer: Self-pay | Admitting: Psychiatry

## 2018-12-15 NOTE — Telephone Encounter (Signed)
No visits in epic. Not sure last appt

## 2018-12-15 NOTE — Telephone Encounter (Signed)
Due to scheduling issues his last visit was a brief phone visit and not noted in detail.

## 2018-12-22 NOTE — Progress Notes (Signed)
Schedule conflict.  Patient not seen. This encounter was created in error - please disregard.

## 2019-02-02 DIAGNOSIS — L738 Other specified follicular disorders: Secondary | ICD-10-CM | POA: Diagnosis not present

## 2019-02-02 DIAGNOSIS — J069 Acute upper respiratory infection, unspecified: Secondary | ICD-10-CM | POA: Diagnosis not present

## 2019-02-02 DIAGNOSIS — I10 Essential (primary) hypertension: Secondary | ICD-10-CM | POA: Diagnosis not present

## 2019-02-02 DIAGNOSIS — G44209 Tension-type headache, unspecified, not intractable: Secondary | ICD-10-CM | POA: Diagnosis not present

## 2019-02-02 DIAGNOSIS — R05 Cough: Secondary | ICD-10-CM | POA: Diagnosis not present

## 2019-02-02 DIAGNOSIS — J029 Acute pharyngitis, unspecified: Secondary | ICD-10-CM | POA: Diagnosis not present

## 2019-02-02 DIAGNOSIS — R5383 Other fatigue: Secondary | ICD-10-CM | POA: Diagnosis not present

## 2019-02-02 DIAGNOSIS — R52 Pain, unspecified: Secondary | ICD-10-CM | POA: Diagnosis not present

## 2019-02-02 DIAGNOSIS — R630 Anorexia: Secondary | ICD-10-CM | POA: Diagnosis not present

## 2019-02-13 DIAGNOSIS — N5201 Erectile dysfunction due to arterial insufficiency: Secondary | ICD-10-CM | POA: Diagnosis not present

## 2019-02-13 DIAGNOSIS — N200 Calculus of kidney: Secondary | ICD-10-CM | POA: Diagnosis not present

## 2019-02-13 DIAGNOSIS — N401 Enlarged prostate with lower urinary tract symptoms: Secondary | ICD-10-CM | POA: Diagnosis not present

## 2019-02-21 ENCOUNTER — Other Ambulatory Visit: Payer: Self-pay | Admitting: Psychiatry

## 2019-02-22 NOTE — Telephone Encounter (Signed)
Need to review last visit, not seen in epic

## 2019-02-23 NOTE — Telephone Encounter (Signed)
Last visit 12/2017, sent message to front staff to get scheduled.

## 2019-02-23 NOTE — Telephone Encounter (Signed)
Scheduled apt for Jan 2021

## 2019-03-19 ENCOUNTER — Other Ambulatory Visit: Payer: Self-pay | Admitting: Psychiatry

## 2019-04-13 ENCOUNTER — Encounter: Payer: Self-pay | Admitting: Psychiatry

## 2019-04-13 ENCOUNTER — Ambulatory Visit (INDEPENDENT_AMBULATORY_CARE_PROVIDER_SITE_OTHER): Payer: Medicare HMO | Admitting: Psychiatry

## 2019-04-13 DIAGNOSIS — F411 Generalized anxiety disorder: Secondary | ICD-10-CM

## 2019-04-13 MED ORDER — ESCITALOPRAM OXALATE 5 MG PO TABS: 5.0000 mg | ORAL_TABLET | Freq: Every evening | ORAL | 3 refills | Status: DC

## 2019-04-13 MED ORDER — LORAZEPAM 1 MG PO TABS: 1.0000 mg | ORAL_TABLET | Freq: Three times a day (TID) | ORAL | 1 refills | Status: DC

## 2019-04-13 NOTE — Progress Notes (Signed)
Howard Garrison [id number] 12-02-1955 64 y.o.  Virtual Visit via Telephone Note  I connected with pt on 04/13/19 at  4:15 PM EST by telephone and verified that I am speaking with the correct person using two identifiers.   I discussed the limitations, risks, security and privacy concerns of performing an evaluation and management service by telephone and the availability of in person appointments. I also discussed with the patient that there may be a patient responsible charge related to this service. The patient expressed understanding and agreed to proceed.   I discussed the assessment and treatment plan with the patient. The patient was provided an opportunity to ask questions and all were answered. The patient agreed with the plan and demonstrated an understanding of the instructions.   The patient was advised to call back or seek an in-person evaluation if the symptoms worsen or if the condition fails to improve as anticipated.  I provided 15 minutes of non-face-to-face time during this encounter.  The patient was located at home.  The provider was located at [facility].   Lauraine Rinne, MD   Subjective:   Patient ID:  Howard Garrison is a 64 y.o. (DOB [birth year]) male.  Chief Complaint:  Chief Complaint  Patient presents with  . Follow-up    Medication Management  . Anxiety    HPI Howard Garrison presents for follow-up of anxiety.  Last seen September 2019.  He had reduced Lexapro from 5 mg nightly to about 2.5 mg a day.  He was given the option to increase it back to 5 mg if needed.  He also continued on lorazepam 1 mg 3 times daily and was usually taking about 2 daily.  He's taking 5 mg Lexapro and about the same amount of lorazepam.  Pleased with it and satsfied.  Sleeps well.   Work commute interferes with exercise.  Health is fine.  Patient reports stable mood and denies depressed or irritable moods.  Patient denies any recent difficulty  with anxiety.  Patient denies difficulty with sleep initiation or maintenance. Denies appetite disturbance.  Patient reports that energy and motivation have been good.  Patient denies any difficulty with concentration.  Patient denies any suicidal ideation.  Howard Garrison doing well.  Taking care of Howard Garrison.  Review of Systems:  Review of Systems  Neurological: Positive for tremors. Negative for weakness.  Psychiatric/Behavioral: Negative for agitation.    Medications: I have reviewed the patient's current medications.  Current Outpatient Medications  Medication Sig Dispense Refill  . amLODipine (NORVASC) 5 MG tablet TAKE ONE TABLET BY MOUTH DAILY **PLEASE MAKE OVERDUE YEARLY APPOINTMENT WITH DR. Eden Emms BEFORE ANY REFILLS 90 tablet 3  . atorvastatin (LIPITOR) 10 MG tablet Take 10 mg by mouth daily.    Marland Kitchen escitalopram (LEXAPRO) 5 MG tablet TAKE ONE TABLET BY MOUTH EVERY EVENING 90 tablet 0  . LORazepam (ATIVAN) 1 MG tablet TAKE ONE TABLET BY MOUTH THREE TIMES A DAY 270 tablet 0  . ramipril (ALTACE) 10 MG capsule TAKE ONE CAPSULE BY MOUTH DAILY 90 capsule 3  . sildenafil (VIAGRA) 100 MG tablet Take by mouth.    . Vitamin D, Cholecalciferol, 25 MCG (1000 UT) CAPS Take 1 capsule by mouth daily.     No current facility-administered medications for this visit.    Medication Side Effects: None  Allergies: No Known Allergies  Past Medical History:  Diagnosis Date  . Aortic insufficiency 09/2008   Mild,/ Moderate  echo, July, 2010  .  Beta-blocker intolerance    July, 2010, metoprolol  . Bicuspid aortic valve 09/2008   Bicuspid aortic valve echo, July, 2010  //  cardiac CT angiogram October, 2011, bicuspid aortic valve  . CAD (coronary artery disease)    Calcium score of 11, single nidus in the proximal LAD, cardiac CT angiogram  October, 2011  . Chest pain 09/2008    stress echo.  IRWE,3154.Marland Kitchennormal...EF % 50-55%  . Dilated aortic root ([place]) 09/2008   echo...44-58mm/july, 2011...echo..45-37mm  .  Ejection fraction    EF 50-55%, echo, July, 2010  . HTN (hypertension)    borderline  . Low HDL (under 40)    LDL 110 09/2008  . Lung nodule    Triangular-shaped nodule along the minor fissure stable and likely subpleural lymph node, CT scan, October 16, 2010 no further CT scans needed by report  . Nephrolithiasis   . Palpitations 09/2008   Holter, one short burst SVT  2 am  . Prolonged Q-T interval on ECG 2010   486ms.Marland KitchenMarland KitchenJuly, 2011  . PVC (premature ventricular contraction)   . S/P cholecystectomy   . Viral illness 07/2008   prolonged    Family History  Problem Relation Age of Onset  . Diabetes Mother   . Prostate cancer Father     Social History   Socioeconomic History  . Marital status: Married    Spouse name: Not on file  . Number of children: Not on file  . Years of education: Not on file  . Highest education level: Not on file  Occupational History  . Not on file  Tobacco Use  . Smoking status: Never Smoker  . Smokeless tobacco: Never Used  Substance and Sexual Activity  . Alcohol use: Yes    Comment: very limited  . Drug use: No  . Sexual activity: Not on file  Other Topics Concern  . Not on file  Social History Narrative  . Not on file   Social Determinants of Health   Financial Resource Strain:   . Difficulty of Paying Living Expenses: Not on file  Food Insecurity:   . Worried About Charity fundraiser in the Last Year: Not on file  . Ran Out of Food in the Last Year: Not on file  Transportation Needs:   . Lack of Transportation (Medical): Not on file  . Lack of Transportation (Non-Medical): Not on file  Physical Activity:   . Days of Exercise per Week: Not on file  . Minutes of Exercise per Session: Not on file  Stress:   . Feeling of Stress : Not on file  Social Connections:   . Frequency of Communication with Friends and Family: Not on file  . Frequency of Social Gatherings with Friends and Family: Not on file  . Attends Religious Services: Not  on file  . Active Member of Clubs or Organizations: Not on file  . Attends Archivist Meetings: Not on file  . Marital Status: Not on file  Intimate Partner Violence:   . Fear of Current or Ex-Partner: Not on file  . Emotionally Abused: Not on file  . Physically Abused: Not on file  . Sexually Abused: Not on file    Past Medical History, Surgical history, Social history, and Family history were reviewed and updated as appropriate.   Please see review of systems for further details on the patient's review from today.   Objective:   Physical Exam:  There were no vitals taken for this visit.  Physical Exam Neurological:     Mental Status: He is alert and oriented to person, place, and time.     Cranial Nerves: No dysarthria.  Psychiatric:        Attention and Perception: Attention and perception normal.        Mood and Affect: Mood normal.        Speech: Speech normal.        Behavior: Behavior is cooperative.        Thought Content: Thought content normal. Thought content is not paranoid or delusional. Thought content does not include homicidal or suicidal ideation. Thought content does not include homicidal or suicidal plan.        Cognition and Memory: Cognition and memory normal.        Judgment: Judgment normal.     Comments: Insight intact     Lab Review:     Component Value Date/Time   NA 139 08/26/2018 1423   K 4.3 08/26/2018 1423   CL 105 08/26/2018 1423   CO2 27 08/26/2018 1423   GLUCOSE 87 08/26/2018 1423   BUN 20 08/26/2018 1423   CREATININE 1.18 08/26/2018 1423   CALCIUM 9.2 08/26/2018 1423   GFRNONAA 64 08/26/2018 1423   GFRAA 74 08/26/2018 1423    No results found for: WBC, RBC, HGB, HCT, PLT, MCV, MCH, MCHC, RDW, LYMPHSABS, MONOABS, EOSABS, BASOSABS  No results found for: POCLITH, LITHIUM   No results found for: PHENYTOIN, PHENOBARB, VALPROATE, CBMZ   .res Assessment: Plan:    Walton was seen today for follow-up and  anxiety.  Diagnoses and all orders for this visit:  Generalized anxiety disorder      Doing well with meds.  No med changes indicated.  FU 1 year  Meredith Staggers, MD, DFAPA   Please see After Visit Summary for patient specific instructions.  Future Appointments  Date Time Provider Department Center  05/27/2019  4:15 PM Wendall Stade, MD [facility]    No orders of the defined types were placed in this encounter.     -------------------------------

## 2019-05-25 NOTE — Progress Notes (Signed)
Patient ID: Howard Garrison, male   DOB: Mar 23, 1956, 64 y.o.   MRN: [id number]    HPI 64 y.o.  who has known bicuspid aortic valve with mild aortic insufficiency. He has mild dilatation of the aortic root History of palpitations and HTN  Occasional palpitations and PVC   Echo 06/04/18 reviewed EF 60-65% mild AS mean gradient 13 peak 23 mmHg and trivial AR   CTA Chest 08/27/18 aorta stable 4.2 cm   Living in [place] to help raise 5 yo grand daughter Commutes to [place]  And [place] a lot for work as Education administrator Has a 4/8 yo grand kids in [place] But his son there is bipolar and has autistic child and has chosen to distance Himself from family    No cardiac symptoms   No Known Allergies  Current Outpatient Medications  Medication Sig Dispense Refill  . amLODipine (NORVASC) 5 MG tablet TAKE ONE TABLET BY MOUTH DAILY **PLEASE MAKE OVERDUE YEARLY APPOINTMENT WITH DR. Eden Emms BEFORE ANY REFILLS 90 tablet 3  . atorvastatin (LIPITOR) 10 MG tablet Take 10 mg by mouth daily.    Marland Kitchen escitalopram (LEXAPRO) 5 MG tablet Take 1 tablet (5 mg total) by mouth every evening. 90 tablet 3  . LORazepam (ATIVAN) 1 MG tablet Take 1 tablet (1 mg total) by mouth 3 (three) times daily. 270 tablet 1  . ramipril (ALTACE) 10 MG capsule TAKE ONE CAPSULE BY MOUTH DAILY 90 capsule 3  . sildenafil (VIAGRA) 100 MG tablet Take by mouth.    . Vitamin D, Cholecalciferol, 25 MCG (1000 UT) CAPS Take 1 capsule by mouth daily.     No current facility-administered medications for this visit.    Social History   Socioeconomic History  . Marital status: Married    Spouse name: Not on file  . Number of children: Not on file  . Years of education: Not on file  . Highest education level: Not on file  Occupational History  . Not on file  Tobacco Use  . Smoking status: Never Smoker  . Smokeless tobacco: Never Used  Substance and Sexual Activity  . Alcohol use: Yes    Comment: very limited  . Drug use: No  . Sexual  activity: Not on file  Other Topics Concern  . Not on file  Social History Narrative  . Not on file   Social Determinants of Health   Financial Resource Strain:   . Difficulty of Paying Living Expenses: Not on file  Food Insecurity:   . Worried About Programme researcher, broadcasting/film/video in the Last Year: Not on file  . Ran Out of Food in the Last Year: Not on file  Transportation Needs:   . Lack of Transportation (Medical): Not on file  . Lack of Transportation (Non-Medical): Not on file  Physical Activity:   . Days of Exercise per Week: Not on file  . Minutes of Exercise per Session: Not on file  Stress:   . Feeling of Stress : Not on file  Social Connections:   . Frequency of Communication with Friends and Family: Not on file  . Frequency of Social Gatherings with Friends and Family: Not on file  . Attends Religious Services: Not on file  . Active Member of Clubs or Organizations: Not on file  . Attends Banker Meetings: Not on file  . Marital Status: Not on file  Intimate Partner Violence:   . Fear of Current or Ex-Partner: Not on file  . Emotionally Abused: Not  on file  . Physically Abused: Not on file  . Sexually Abused: Not on file    Family History  Problem Relation Age of Onset  . Diabetes Mother   . Prostate cancer Father     Past Medical History:  Diagnosis Date  . Aortic insufficiency 09/2008   Mild,/ Moderate  echo, July, 2010  . Beta-blocker intolerance    July, 2010, metoprolol  . Bicuspid aortic valve 09/2008   Bicuspid aortic valve echo, July, 2010  //  cardiac CT angiogram October, 2011, bicuspid aortic valve  . CAD (coronary artery disease)    Calcium score of 11, single nidus in the proximal LAD, cardiac CT angiogram  October, 2011  . Chest pain 09/2008    stress echo.  UYQI,3474.Marland Kitchennormal...EF % 50-55%  . Dilated aortic root ([place]) 09/2008   echo...44-42mm/july, 2011...echo..45-53mm  . Ejection fraction    EF 50-55%, echo, July, 2010  . HTN  (hypertension)    borderline  . Low HDL (under 40)    LDL 110 09/2008  . Lung nodule    Triangular-shaped nodule along the minor fissure stable and likely subpleural lymph node, CT scan, October 16, 2010 no further CT scans needed by report  . Nephrolithiasis   . Palpitations 09/2008   Holter, one short burst SVT  2 am  . Prolonged Q-T interval on ECG 2010   429ms.Marland KitchenMarland KitchenJuly, 2011  . PVC (premature ventricular contraction)   . S/P cholecystectomy   . Viral illness 07/2008   prolonged    Past Surgical History:  Procedure Laterality Date  . CHOLECYSTECTOMY    . KNEE ARTHROSCOPY     x 4    Patient Active Problem List   Diagnosis Date Noted  . Anxiety 02/17/2014  . Lung nodule   . Prolonged Q-T interval on ECG   . PVC (premature ventricular contraction)   . Beta-blocker intolerance   . Low HDL (under 40)   . Nephrolithiasis   . HTN (hypertension)   . S/P cholecystectomy   . Ejection fraction   . CAD (coronary artery disease)   . Palpitations 09/30/2008  . Chest pain 09/30/2008  . Aortic insufficiency 09/30/2008  . Bicuspid aortic valve 09/30/2008  . Dilated aortic root ([place]) 09/30/2008  . Viral illness 07/31/2008    ROS  Patient denies fever, chills, headache, sweats, rash, change in vision, change in hearing, cough, nausea or vomiting, urinary symptoms. All other systems are reviewed and are negative.  PHYSICAL EXAM Affect appropriate Healthy:  appears stated age 76: normal Neck supple with no adenopathy JVP normal no bruits no thyromegaly Lungs clear with no wheezing and good diaphragmatic motion Heart:  S1/S2 SEM / AR  murmur, no rub, gallop or click PMI normal Abdomen: benighn, BS positve, no tenderness, no AAA no bruit.  No HSM or HJR Distal pulses intact with no bruits No edema Neuro non-focal Skin warm and dry No muscular weakness    Vitals:   05/27/19 1609  BP: 126/78  Pulse: 63  SpO2: 96%  Weight: 211 lb 6.4 oz (95.9 kg)  Height: 6\' 1"  (1.854 m)      EKG  05/27/19 SR rate 63  PR 206 nonspecific ST changes   ASSESSMENT & PLAN  Bicuspid AV mean gradient 13 mild AR Trivial AR TTE 06/04/18 will consider repeating in a year or with change in murmur or symptoms  HTN:  Well controlled.  Continue current medications and low sodium Dash type diet.    Anxiety: stable f/u  psychiatry. Doing well on low dose lexapro and ativan  Palpitations  Benign no beta blocker given bradycardia and long PR   Aorta:  Ascending root 4.2 cm CT 08/27/18 f/u again May 2022   F/U with me in a year    Charlton Haws

## 2019-05-27 ENCOUNTER — Ambulatory Visit: Payer: Medicare HMO | Admitting: Cardiovascular Disease

## 2019-05-27 ENCOUNTER — Other Ambulatory Visit: Payer: Self-pay

## 2019-05-27 ENCOUNTER — Encounter: Payer: Self-pay | Admitting: Cardiovascular Disease

## 2019-05-27 VITALS — BP 126/78 | HR 63 | Ht 73.0 in | Wt 211.4 lb

## 2019-05-27 DIAGNOSIS — Q231 Congenital insufficiency of aortic valve: Secondary | ICD-10-CM

## 2019-05-27 DIAGNOSIS — I10 Essential (primary) hypertension: Secondary | ICD-10-CM | POA: Diagnosis not present

## 2019-05-27 DIAGNOSIS — R002 Palpitations: Secondary | ICD-10-CM | POA: Diagnosis not present

## 2019-05-27 NOTE — Patient Instructions (Signed)
Medication Instructions:  *If you need a refill on your cardiac medications before your next appointment, please call your pharmacy*  Lab Work: If you have labs (blood work) drawn today and your tests are completely normal, you will receive your results only by: Marland Kitchen MyChart Message (if you have MyChart) OR . A paper copy in the mail If you have any lab test that is abnormal or we need to change your treatment, we will call you to review the results.  Testing/Procedures:   Follow-Up: At [facility], you and your health needs are our priority.  As part of our continuing mission to provide you with exceptional heart care, we have created designated Provider Care Teams.  These Care Teams include your primary Cardiologist (physician) and Advanced Practice Providers (APPs -  Physician Assistants and Nurse Practitioners) who all work together to provide you with the care you need, when you need it.  Your next appointment:   1 year(s)  The format for your next appointment:   In Person  Provider:   You may see Dr. Eden Emms or one of the following Advanced Practice Providers on your designated Care Team:    Norma Fredrickson, NP  Nada Boozer, NP  Georgie Chard, NP

## 2019-06-13 ENCOUNTER — Other Ambulatory Visit: Payer: Self-pay | Admitting: Cardiovascular Disease

## 2019-06-15 ENCOUNTER — Other Ambulatory Visit: Payer: Self-pay | Admitting: Cardiovascular Disease

## 2019-07-08 LAB — COLOGUARD

## 2019-08-10 ENCOUNTER — Other Ambulatory Visit: Payer: Self-pay

## 2019-08-10 ENCOUNTER — Telehealth: Payer: Self-pay | Admitting: Psychiatry

## 2019-08-10 ENCOUNTER — Other Ambulatory Visit: Payer: Self-pay | Admitting: Psychiatry

## 2019-08-10 DIAGNOSIS — F411 Generalized anxiety disorder: Secondary | ICD-10-CM

## 2019-08-10 MED ORDER — LORAZEPAM 1 MG PO TABS: 1.0000 mg | ORAL_TABLET | Freq: Three times a day (TID) | ORAL | 1 refills | Status: DC

## 2019-08-10 NOTE — Telephone Encounter (Signed)
Pt called to say he is out of Lorazepam. Not sure why because pharmacy says he can't refill until 5/22. Can we check on this?

## 2019-08-10 NOTE — Telephone Encounter (Signed)
10 days early on 90 day RX.  OK early refill.  No history of abuses pattern.

## 2020-01-07 IMAGING — CT CT ANGIOGRAPHY CHEST
2 of 7 series · 18 of 46 positions shown · IV contrast (OMNIPAQUE 350)
Comparison: 10/13/2010

CLINICAL DATA: Follow-up aortic valve insufficiency.

EXAM:
CT ANGIOGRAPHY CHEST WITH CONTRAST
TECHNIQUE: Multidetector CT imaging of the chest was performed using the
standard protocol during bolus administration of intravenous
contrast. Multiplanar CT image reconstructions and MIPs were
obtained to evaluate the vascular anatomy.
CONTRAST:  100mL OMNIPAQUE IOHEXOL 350 MG/ML SOLN

[Series 4: aorta 3.0 i31f 2 · axial · 0.83mm/px · z∈[-352,-43]mm · 15 of 111 slices shown]
[im 4/111  lung]
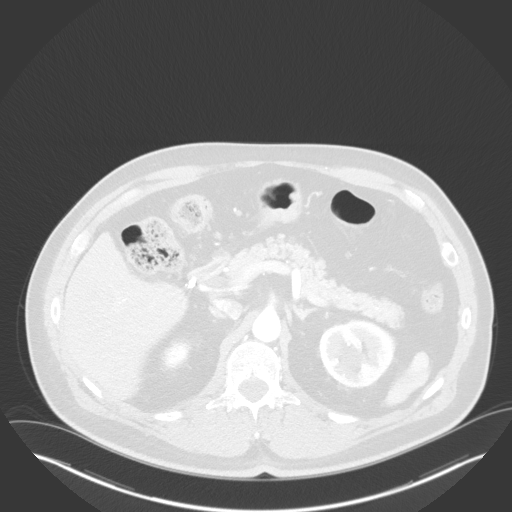
[im 12/111  soft-tissue]
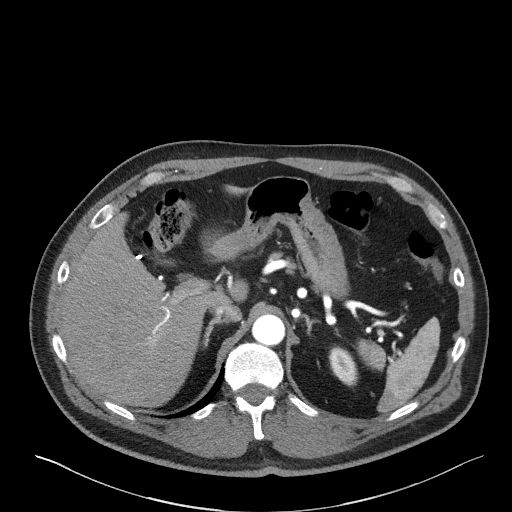
[im 20/111  lung]
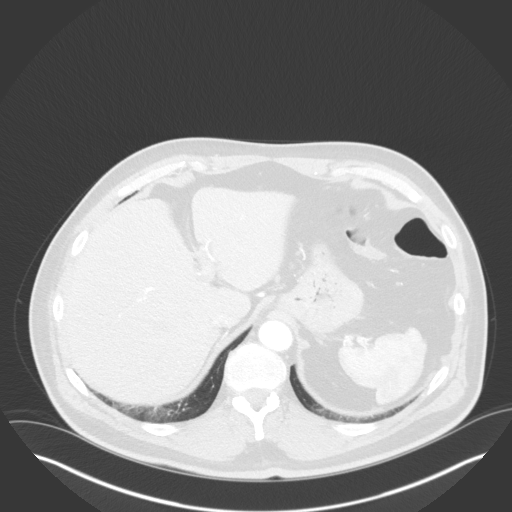
[im 28/111  soft-tissue]
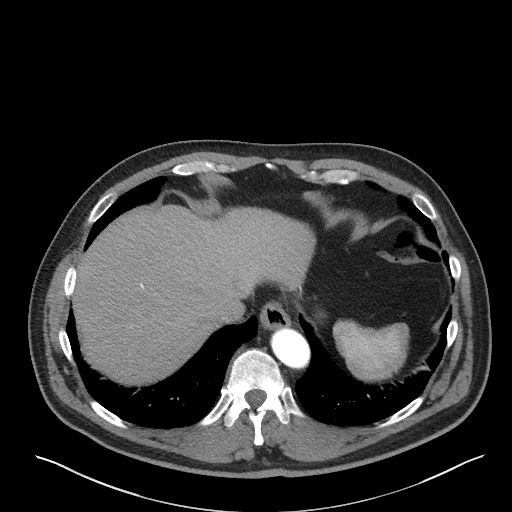
[im 36/111  lung]
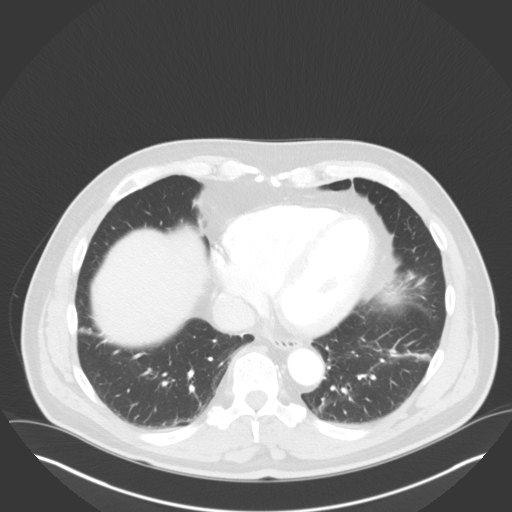
[im 40/111  soft-tissue]
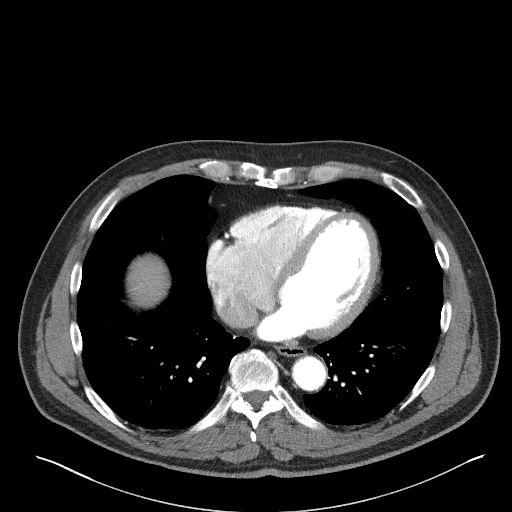
[im 48/111  lung]
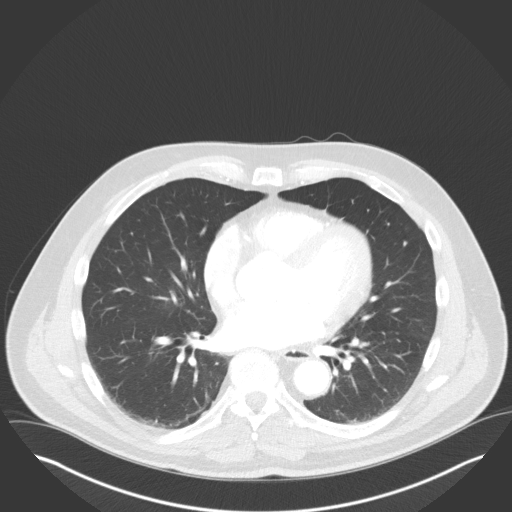
[im 56/111  soft-tissue]
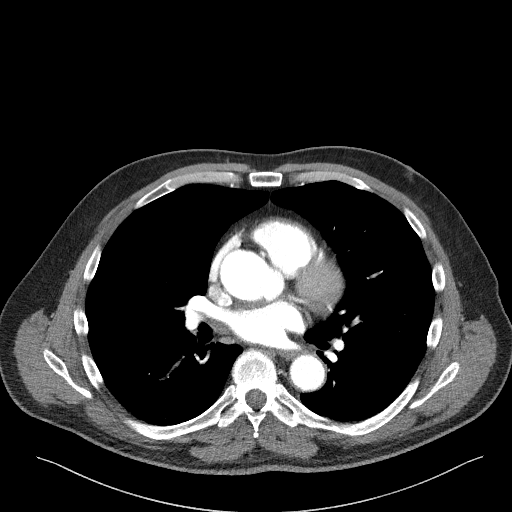
[im 63/111  lung]
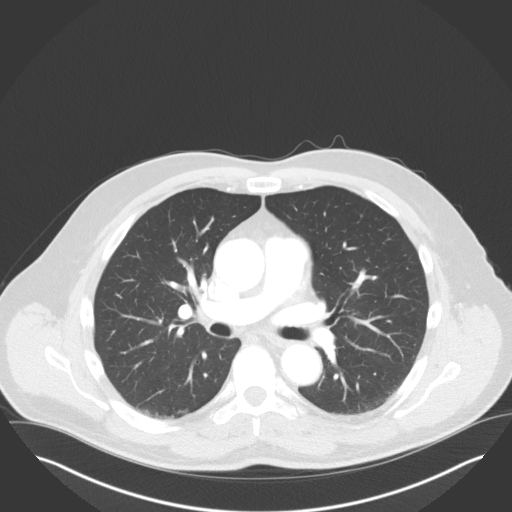
[im 71/111  soft-tissue]
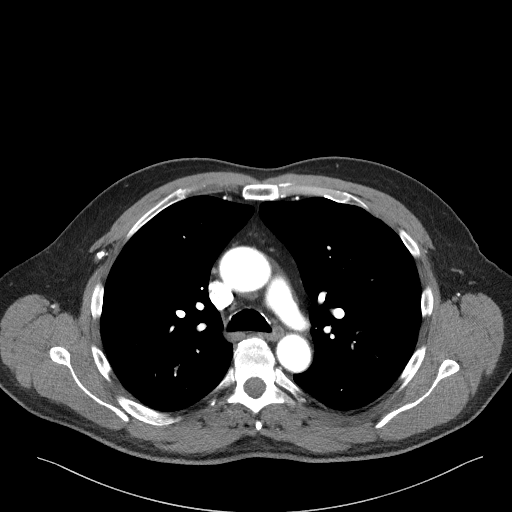
[im 75/111  lung]
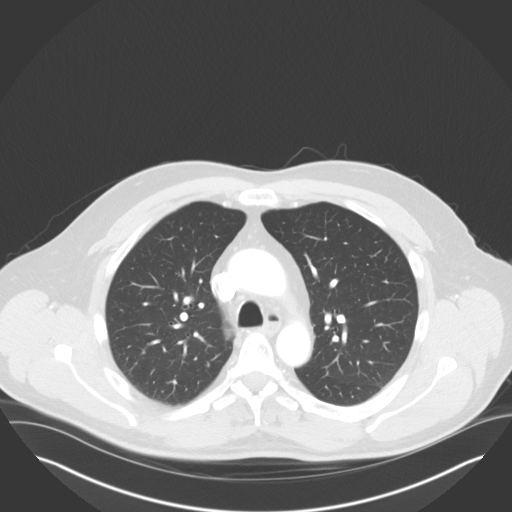
[im 83/111  soft-tissue]
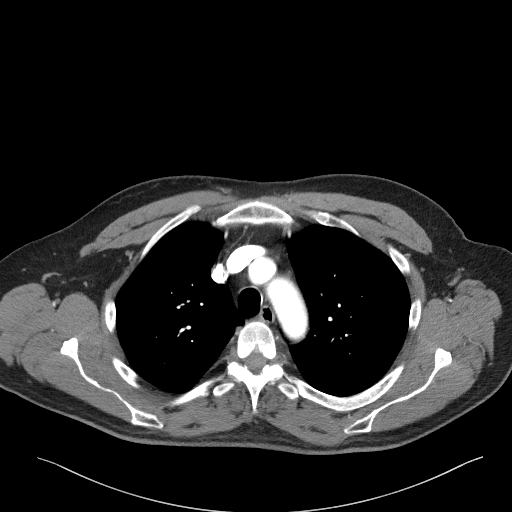
[im 91/111  lung]
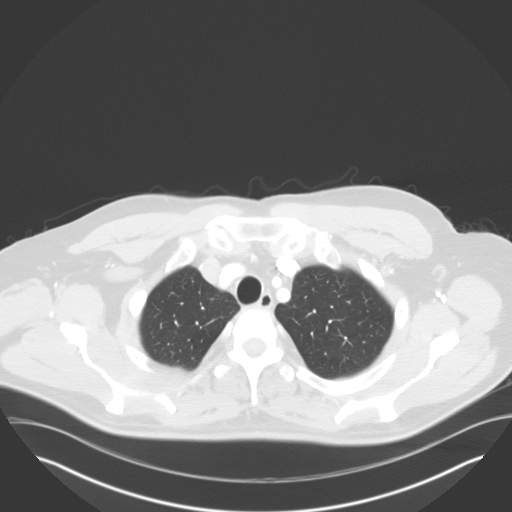
[im 99/111  soft-tissue]
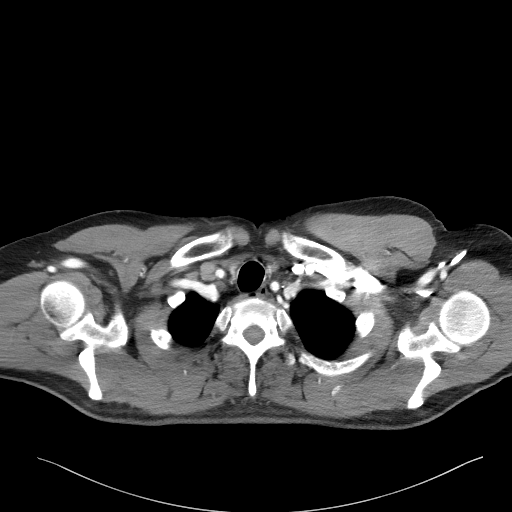
[im 107/111  lung]
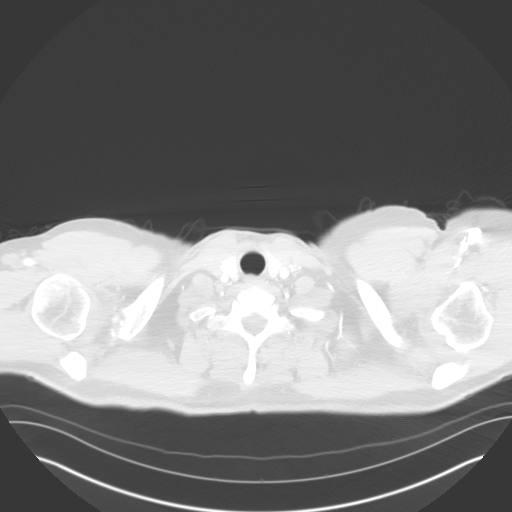

[Series 7: coronals · coronal · 0.64mm/px · 3 of 124 slices shown]
[im 31/124  soft-tissue]
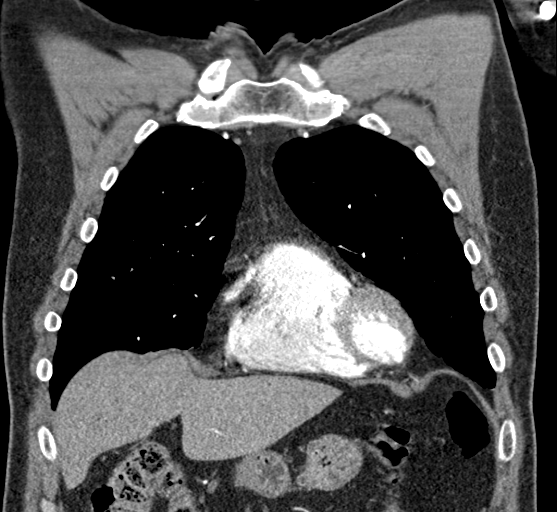
[im 62/124  soft-tissue]
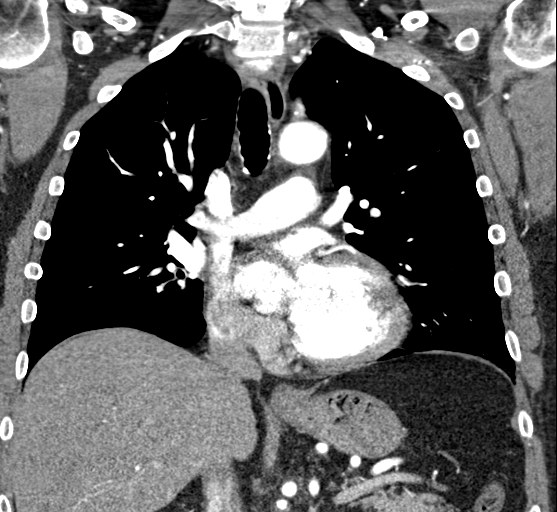
[im 93/124  soft-tissue]
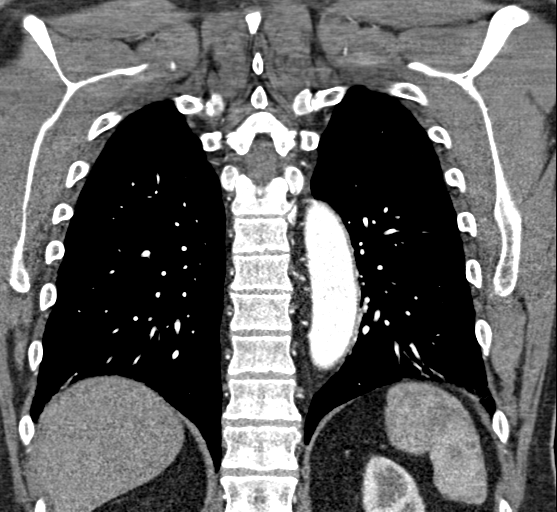

[18 of 46 positions shown; findings below may reference images not displayed]

FINDINGS: Cardiovascular: Maximal diameter of the ascending aorta at the sinus
of Valsalva, Meisha junction, and ascending aorta are 4.6 cm,
3.7 cm, and 4.2 cm. This compares with a maximal diameter of the
ascending aorta of 4.3 cm on the prior study. Overall, there has
been no significant change. There is no obvious evidence of acute
intramural hematoma or aortic dissection allowing for cardiac motion
artifact. Great vessels are patent. Vertebral arteries are patent
within the confines of the examination. No obvious evidence of acute
pulmonary thromboembolism. Moderate aortic valvular calcification
has progressed since the prior study. No significant coronary artery
calcification.

Mediastinum/Nodes: No abnormal mediastinal adenopathy or pericardial
effusion. Thyroid is normal in appearance. Small hiatal hernia is
suspected.

Lungs/Pleura: Visceral pleural lymph node in the right minor fissure
on image 60 of series 5 is stable. Dependent atelectasis. No
pneumothorax or pleural effusion.

Upper Abdomen: No acute abnormality.

Musculoskeletal: No vertebral compression deformity.

Review of the MIP images confirms the above findings.
IMPRESSION: Stable aneurysmal dilatation of the ascending aorta at 4.2 cm.
Recommend annual imaging followup by CTA or MRA. This recommendation
follows 9363 ACCF/AHA/AATS/ACR/ASA/SCA/PAULLA/CEOLA/FINN/BELEVAN Guidelines
for the Diagnosis and Management of Patients with Thoracic Aortic
Disease. Circulation. 9363; 121: E266-e369. Aortic aneurysm NOS
([id number].S)

Aortic valvular calcification has progressed since the prior study.
Echocardiogram was performed 06/04/2018 demonstrating prominent
aortic valvular calcification with bicuspid morphology and some
degree of regurgitation and stenosis.

## 2020-02-04 ENCOUNTER — Other Ambulatory Visit: Payer: Self-pay | Admitting: Psychiatry

## 2020-02-04 DIAGNOSIS — F411 Generalized anxiety disorder: Secondary | ICD-10-CM

## 2020-03-05 LAB — COLOGUARD: COLOGUARD: NEGATIVE

## 2020-03-14 ENCOUNTER — Other Ambulatory Visit: Payer: Self-pay | Admitting: Cardiovascular Disease

## 2020-03-14 ENCOUNTER — Other Ambulatory Visit: Payer: Self-pay | Admitting: Psychiatry

## 2020-03-14 DIAGNOSIS — F411 Generalized anxiety disorder: Secondary | ICD-10-CM

## 2020-04-20 ENCOUNTER — Encounter: Payer: Self-pay | Admitting: Psychiatry

## 2020-04-20 ENCOUNTER — Telehealth (INDEPENDENT_AMBULATORY_CARE_PROVIDER_SITE_OTHER): Payer: Medicare HMO | Admitting: Psychiatry

## 2020-04-20 DIAGNOSIS — F411 Generalized anxiety disorder: Secondary | ICD-10-CM | POA: Diagnosis not present

## 2020-04-20 MED ORDER — ESCITALOPRAM OXALATE 10 MG PO TABS: 10.0000 mg | ORAL_TABLET | Freq: Every evening | ORAL | 1 refills | Status: DC

## 2020-04-20 MED ORDER — LORAZEPAM 1 MG PO TABS: 1.0000 mg | ORAL_TABLET | Freq: Three times a day (TID) | ORAL | 1 refills | Status: DC

## 2020-04-20 NOTE — Progress Notes (Signed)
Howard Garrison [id number] 1955/10/18 65 y.o.  Virtual Visit via Telephone Note  I connected with pt on 04/20/20 at 10:15 AM EST by telephone and verified that I am speaking with the correct person using two identifiers.   I discussed the limitations, risks, security and privacy concerns of performing an evaluation and management service by telephone and the availability of in person appointments. I also discussed with the patient that there may be a patient responsible charge related to this service. The patient expressed understanding and agreed to proceed.   I discussed the assessment and treatment plan with the patient. The patient was provided an opportunity to ask questions and all were answered. The patient agreed with the plan and demonstrated an understanding of the instructions.   The patient was advised to call back or seek an in-person evaluation if the symptoms worsen or if the condition fails to improve as anticipated.  I provided 15 minutes of non-face-to-face time during this encounter.  The patient was located at home.  The provider was located at [facility].   Lauraine Rinne, MD   Subjective:   Patient ID:  Howard Garrison is a 65 y.o. (DOB 04/11/1955) male.  Chief Complaint:  Chief Complaint  Patient presents with  . Follow-up  . Anxiety  . Stress    HPI Howard Garrison presents for follow-up of anxiety.  seen September 2019.  He had reduced Lexapro from 5 mg nightly to about 2.5 mg a day.  He was given the option to increase it back to 5 mg if needed.  He also continued on lorazepam 1 mg 3 times daily and was usually taking about 2 daily.  04/13/2019 appt with noted: He's taking 5 mg BID Lexapro for 2 weeks and it's helped some.  Less rumination on the situation with his D.  the same amount of lorazepam.   Rough year with D taking GD away from them.  Been very stressed.  [name] 65 yo.  Patient reports stable mood and denies depressed  or irritable moods.  Patient denies any recent difficulty with anxiety. Not sleeping as well thinking about these things. Denies appetite disturbance.  Patient reports that energy and motivation have been good.  Patient denies any difficulty with concentration.  Patient denies any suicidal ideation.   Past Psychiatric Medication Trials: Lexapro 10, Ativan 1 mg TID  Review of Systems:  Review of Systems  Cardiovascular: Negative for palpitations.  Neurological: Positive for tremors. Negative for weakness.  Psychiatric/Behavioral: Negative for agitation.    Medications: I have reviewed the patient's current medications.  Current Outpatient Medications  Medication Sig Dispense Refill  . amLODipine (NORVASC) 5 MG tablet Take 1 tablet (5 mg total) by mouth daily. Please make yearly appt with Dr. Eden Emms for February 2022 for future refills. Thank you 1st attempt 90 tablet 0  . atorvastatin (LIPITOR) 10 MG tablet Take 10 mg by mouth daily.    . ramipril (ALTACE) 10 MG capsule Take 1 capsule (10 mg total) by mouth daily. Please make yearly appt with Dr. Eden Emms for February 2022 for future refills. Thank you 1st attempt 90 capsule 0  . sildenafil (VIAGRA) 100 MG tablet Take by mouth.    . Vitamin D, Cholecalciferol, 25 MCG (1000 UT) CAPS Take 1 capsule by mouth daily.    Marland Kitchen escitalopram (LEXAPRO) 10 MG tablet Take 1 tablet (10 mg total) by mouth every evening. 90 tablet 1  . LORazepam (ATIVAN) 1 MG tablet Take  1 tablet (1 mg total) by mouth 3 (three) times daily. 270 tablet 1   No current facility-administered medications for this visit.    Medication Side Effects: None  Allergies: No Known Allergies  Past Medical History:  Diagnosis Date  . Aortic insufficiency 09/2008   Mild,/ Moderate  echo, July, 2010  . Beta-blocker intolerance    July, 2010, metoprolol  . Bicuspid aortic valve 09/2008   Bicuspid aortic valve echo, July, 2010  //  cardiac CT angiogram October, 2011, bicuspid aortic  valve  . CAD (coronary artery disease)    Calcium score of 11, single nidus in the proximal LAD, cardiac CT angiogram  October, 2011  . Chest pain 09/2008    stress echo.  ZOXW,9604.Marland Kitchennormal...EF % 50-55%  . Dilated aortic root (HCC) 09/2008   echo...44-66mm/july, 2011...echo..45-16mm  . Ejection fraction    EF 50-55%, echo, July, 2010  . HTN (hypertension)    borderline  . Low HDL (under 40)    LDL 110 07/4096  . Lung nodule    Triangular-shaped nodule along the minor fissure stable and likely subpleural lymph node, CT scan, October 16, 2010 no further CT scans needed by report  . Nephrolithiasis   . Palpitations 09/2008   Holter, one short burst SVT  2 am  . Prolonged Q-T interval on ECG 2010   .Marland KitchenMarland KitchenJuly, 2011  . PVC (premature ventricular contraction)   . S/P cholecystectomy   . Viral illness 07/2008   prolonged    Family History  Problem Relation Age of Onset  . Diabetes Mother   . Prostate cancer Father     Social History   Socioeconomic History  . Marital status: Married    Spouse name: Not on file  . Number of children: Not on file  . Years of education: Not on file  . Highest education level: Not on file  Occupational History  . Not on file  Tobacco Use  . Smoking status: Never Smoker  . Smokeless tobacco: Never Used  Substance and Sexual Activity  . Alcohol use: Yes    Comment: very limited  . Drug use: No  . Sexual activity: Not on file  Other Topics Concern  . Not on file  Social History Narrative  . Not on file   Social Determinants of Health   Financial Resource Strain: Not on file  Food Insecurity: Not on file  Transportation Needs: Not on file  Physical Activity: Not on file  Stress: Not on file  Social Connections: Not on file  Intimate Partner Violence: Not on file    Past Medical History, Surgical history, Social history, and Family history were reviewed and updated as appropriate.   Please see review of systems for further details on  the patient's review from today.   Objective:   Physical Exam:  There were no vitals taken for this visit.  Physical Exam Neurological:     Mental Status: He is alert and oriented to person, place, and time.     Cranial Nerves: No dysarthria.  Psychiatric:        Attention and Perception: Attention and perception normal.        Mood and Affect: Mood is anxious and depressed.        Speech: Speech normal.        Behavior: Behavior is cooperative.        Thought Content: Thought content normal. Thought content is not paranoid or delusional. Thought content does not include homicidal or suicidal  ideation. Thought content does not include homicidal or suicidal plan.        Cognition and Memory: Cognition and memory normal.        Judgment: Judgment normal.     Comments: Insight intact     Lab Review:     Component Value Date/Time   NA 139 08/26/2018 1423   K 4.3 08/26/2018 1423   CL 105 08/26/2018 1423   CO2 27 08/26/2018 1423   GLUCOSE 87 08/26/2018 1423   BUN 20 08/26/2018 1423   CREATININE 1.18 08/26/2018 1423   CALCIUM 9.2 08/26/2018 1423   GFRNONAA 64 08/26/2018 1423   GFRAA 74 08/26/2018 1423    No results found for: WBC, RBC, HGB, HCT, PLT, MCV, MCH, MCHC, RDW, LYMPHSABS, MONOABS, EOSABS, BASOSABS  No results found for: POCLITH, LITHIUM   No results found for: PHENYTOIN, PHENOBARB, VALPROATE, CBMZ   .res Assessment: Plan:    Zade was seen today for follow-up, anxiety and stress.  Diagnoses and all orders for this visit:  Generalized anxiety disorder -     LORazepam (ATIVAN) 1 MG tablet; Take 1 tablet (1 mg total) by mouth 3 (three) times daily. -     escitalopram (LEXAPRO) 10 MG tablet; Take 1 tablet (10 mg total) by mouth every evening.  supportive therapy dealing with trauma with D and not being able to see his GD.    Doing well with meds. QTC 454 05/2019 and FU Feb  Agreed increase Lexapro 10 mg daily.  FU 1 year  Meredith Staggers, MD,  DFAPA   Please see After Visit Summary for patient specific instructions.  Future Appointments  Date Time Provider Department Center  07/05/2020  8:45 AM Wendall Stade, MD [facility]    No orders of the defined types were placed in this encounter.     -------------------------------

## 2020-06-12 ENCOUNTER — Other Ambulatory Visit: Payer: Self-pay | Admitting: Cardiovascular Disease

## 2020-06-28 NOTE — Progress Notes (Signed)
Patient ID: Howard Garrison, male   DOB: Apr 13, 1955, 65 y.o.   MRN: [id number]    HPI 65 y.o.  who has known bicuspid aortic valve with mild aortic insufficiency. He has mild dilatation of the aortic root History of palpitations and HTN  Occasional palpitations and PVC   Echo 06/04/18 reviewed EF 60-65% mild AS mean gradient 13 peak 23 mmHg and trivial AR   CTA Chest 08/27/18 aorta stable 4.2 cm   Unfortunately now estranged from daughter in [place] and has not seen his 25 yo grand daughter [place] Livings in a year. Getting along better with bipolar son in [place] who has 2 kids  Will need TKR;s when he retires    No cardiac symptoms    No Known Allergies  Current Outpatient Medications  Medication Sig Dispense Refill  . amLODipine (NORVASC) 5 MG tablet TAKE ONE TABLET BY MOUTH DAILY 30 tablet 0  . atorvastatin (LIPITOR) 10 MG tablet Take 10 mg by mouth daily.    Marland Kitchen escitalopram (LEXAPRO) 10 MG tablet Take 1 tablet (10 mg total) by mouth every evening. 90 tablet 1  . LORazepam (ATIVAN) 1 MG tablet Take 1 tablet (1 mg total) by mouth 3 (three) times daily. 270 tablet 1  . ramipril (ALTACE) 10 MG capsule TAKE ONE CAPSULE BY MOUTH DAILY PLEASE MAKE YEARLY APPOINTMENT WITH DR. Eden Emms FOR FEBRUARY 2022 FOR FUTURE REFILLS 30 capsule 0  . sildenafil (VIAGRA) 100 MG tablet Take by mouth.    . Vitamin D, Cholecalciferol, 25 MCG (1000 UT) CAPS Take 1 capsule by mouth daily.     No current facility-administered medications for this visit.    Social History   Socioeconomic History  . Marital status: Married    Spouse name: Not on file  . Number of children: Not on file  . Years of education: Not on file  . Highest education level: Not on file  Occupational History  . Not on file  Tobacco Use  . Smoking status: Never Smoker  . Smokeless tobacco: Never Used  Substance and Sexual Activity  . Alcohol use: Yes    Comment: very limited  . Drug use: No  . Sexual activity: Not on file  Other Topics  Concern  . Not on file  Social History Narrative  . Not on file   Social Determinants of Health   Financial Resource Strain: Not on file  Food Insecurity: Not on file  Transportation Needs: Not on file  Physical Activity: Not on file  Stress: Not on file  Social Connections: Not on file  Intimate Partner Violence: Not on file    Family History  Problem Relation Age of Onset  . Diabetes Mother   . Prostate cancer Father     Past Medical History:  Diagnosis Date  . Aortic insufficiency 09/2008   Mild,/ Moderate  echo, July, 2010  . Beta-blocker intolerance    July, 2010, metoprolol  . Bicuspid aortic valve 09/2008   Bicuspid aortic valve echo, July, 2010  //  cardiac CT angiogram October, 2011, bicuspid aortic valve  . CAD (coronary artery disease)    Calcium score of 11, single nidus in the proximal LAD, cardiac CT angiogram  October, 2011  . Chest pain 09/2008    stress echo.  WUJW,1191.Marland Kitchennormal...EF % 50-55%  . Dilated aortic root (HCC) 09/2008   echo...44-35mm/july, 2011...echo..45-13mm  . Ejection fraction    EF 50-55%, echo, July, 2010  . HTN (hypertension)    borderline  . Low HDL (  under 40)    LDL 110 09/2008  . Lung nodule    Triangular-shaped nodule along the minor fissure stable and likely subpleural lymph node, CT scan, October 16, 2010 no further CT scans needed by report  . Nephrolithiasis   . Palpitations 09/2008   Holter, one short burst SVT  2 am  . Prolonged Q-T interval on ECG 2010   .Marland KitchenMarland KitchenJuly, 2011  . PVC (premature ventricular contraction)   . S/P cholecystectomy   . Viral illness 07/2008   prolonged    Past Surgical History:  Procedure Laterality Date  . CHOLECYSTECTOMY    . KNEE ARTHROSCOPY     x 4    Patient Active Problem List   Diagnosis Date Noted  . Anxiety 02/17/2014  . Lung nodule   . Prolonged Q-T interval on ECG   . PVC (premature ventricular contraction)   . Beta-blocker intolerance   . Low HDL (under 40)   . Nephrolithiasis    . HTN (hypertension)   . S/P cholecystectomy   . Ejection fraction   . CAD (coronary artery disease)   . Palpitations 09/30/2008  . Chest pain 09/30/2008  . Aortic insufficiency 09/30/2008  . Bicuspid aortic valve 09/30/2008  . Dilated aortic root (HCC) 09/30/2008  . Viral illness 07/31/2008    ROS  Patient denies fever, chills, headache, sweats, rash, change in vision, change in hearing, cough, nausea or vomiting, urinary symptoms. All other systems are reviewed and are negative.  PHYSICAL EXAM Affect appropriate Healthy:  appears stated age HEENT: normal Neck supple with no adenopathy JVP normal no bruits no thyromegaly Lungs clear with no wheezing and good diaphragmatic motion Heart:  S1/S2 SEM / AR  murmur, no rub, gallop or click PMI normal Abdomen: benighn, BS positve, no tenderness, no AAA no bruit.  No HSM or HJR Distal pulses intact with no bruits No edema Neuro non-focal Skin warm and dry No muscular weakness   Vitals:   07/05/20 0840  BP: 106/64  Pulse: (!) 54  SpO2: 97%  Weight: 98 kg  Height: 6\' 1"  (1.854 m)     EKG  05/27/19 SR rate 63  PR 206 nonspecific ST changes 07/05/2020 SR rate 54 nonspecific ST changes   ASSESSMENT & PLAN  Bicuspid AV mean gradient 13 mild AS Trivial AR TTE 06/04/18 needs updated echo ordered 5/3  HTN:  Well controlled.  Continue current medications and low sodium Dash type diet.    Anxiety: stable f/u psychiatry. Doing well on low dose lexapro and ativan  Palpitations  Benign no beta blocker given bradycardia and long PR   Aorta:  Ascending root 4.2 cm CT 08/27/18 f/u again May 2022    Echo for AS CTA for aortic root dilation May 2022    June 2022

## 2020-06-29 ENCOUNTER — Telehealth: Payer: Self-pay

## 2020-06-29 DIAGNOSIS — Q231 Congenital insufficiency of aortic valve: Secondary | ICD-10-CM

## 2020-06-29 DIAGNOSIS — I712 Thoracic aortic aneurysm, without rupture, unspecified: Secondary | ICD-10-CM

## 2020-06-29 NOTE — Telephone Encounter (Signed)
-----   Message from Wendall Stade, MD sent at 06/28/2020  5:00 PM EDT ----- Any chance to do chest CTA for aortic aneurysm and echo for bicuspid AV after his visit 4/5 same day as he travels from [place] area May need istat Cr or BMET before CTA

## 2020-07-01 NOTE — Telephone Encounter (Signed)
Patient will have done later on in the month.

## 2020-07-05 ENCOUNTER — Other Ambulatory Visit: Payer: Self-pay

## 2020-07-05 ENCOUNTER — Ambulatory Visit: Payer: Medicare HMO | Admitting: Cardiovascular Disease

## 2020-07-05 ENCOUNTER — Encounter: Payer: Self-pay | Admitting: Cardiovascular Disease

## 2020-07-05 ENCOUNTER — Other Ambulatory Visit: Payer: Medicare HMO

## 2020-07-05 VITALS — BP 106/64 | HR 54 | Ht 73.0 in | Wt 216.0 lb

## 2020-07-05 DIAGNOSIS — Q231 Congenital insufficiency of aortic valve: Secondary | ICD-10-CM | POA: Diagnosis not present

## 2020-07-05 DIAGNOSIS — I712 Thoracic aortic aneurysm, without rupture, unspecified: Secondary | ICD-10-CM

## 2020-07-05 DIAGNOSIS — I10 Essential (primary) hypertension: Secondary | ICD-10-CM

## 2020-07-05 DIAGNOSIS — R002 Palpitations: Secondary | ICD-10-CM | POA: Diagnosis not present

## 2020-07-05 LAB — BASIC METABOLIC PANEL
BUN/Creatinine Ratio: 32 — ABNORMAL HIGH (ref 10–24)
BUN: 31 mg/dL — ABNORMAL HIGH (ref 8–27)
CO2: 21 mmol/L (ref 20–29)
Calcium: 9 mg/dL (ref 8.6–10.2)
Chloride: 107 mmol/L — ABNORMAL HIGH (ref 96–106)
Creatinine, Ser: 0.97 mg/dL (ref 0.76–1.27)
Glucose: 121 mg/dL — ABNORMAL HIGH (ref 65–99)
Potassium: 4.4 mmol/L (ref 3.5–5.2)
Sodium: 141 mmol/L (ref 134–144)
eGFR: 86 mL/min/{1.73_m2} (ref >59)

## 2020-07-05 NOTE — Patient Instructions (Addendum)
Medication Instructions:  *If you need a refill on your cardiac medications before your next appointment, please call your pharmacy*  Lab Work: Your physician recommends that you have lab work today- BMET  If you have labs (blood work) drawn today and your tests are completely normal, you will receive your results only by: Marland Kitchen MyChart Message (if you have MyChart) OR . A paper copy in the mail If you have any lab test that is abnormal or we need to change your treatment, we will call you to review the results.  Testing/Procedures: Your physician has requested that you have an echocardiogram on  May 3rd. Echocardiography is a painless test that uses sound waves to create images of your heart. It provides your doctor with information about the size and shape of your heart and how well your heart's chambers and valves are working. This procedure takes approximately one hour. There are no restrictions for this procedure.  Chest CTA scanning on May 3rd, (CAT scanning), is a noninvasive, special x-ray that produces cross-sectional images of the body using x-rays and a computer. CT scans help physicians diagnose and treat medical conditions. For some CT exams, a contrast material is used to enhance visibility in the area of the body being studied. CT scans provide greater clarity and reveal more details than regular x-ray exams.  Follow-Up: At [facility], you and your health needs are our priority.  As part of our continuing mission to provide you with exceptional heart care, we have created designated Provider Care Teams.  These Care Teams include your primary Cardiologist (physician) and Advanced Practice Providers (APPs -  Physician Assistants and Nurse Practitioners) who all work together to provide you with the care you need, when you need it.  We recommend signing up for the patient portal called "MyChart".  Sign up information is provided on this After Visit Summary.  MyChart is used to connect  with patients for Virtual Visits (Telemedicine).  Patients are able to view lab/test results, encounter notes, upcoming appointments, etc.  Non-urgent messages can be sent to your provider as well.   To learn more about what you can do with MyChart, go to [email or web address].    Your next appointment:   12 month(s)  The format for your next appointment:   In Person  Provider:   You may see Dr. Eden Emms or one of the following Advanced Practice Providers on your designated Care Team:    Georgie Chard, NP

## 2020-07-12 ENCOUNTER — Other Ambulatory Visit: Payer: Self-pay | Admitting: Cardiovascular Disease

## 2020-08-02 ENCOUNTER — Other Ambulatory Visit: Payer: Self-pay

## 2020-08-02 ENCOUNTER — Ambulatory Visit (HOSPITAL_COMMUNITY): Payer: Medicare HMO | Attending: Cardiovascular Disease

## 2020-08-02 ENCOUNTER — Ambulatory Visit (INDEPENDENT_AMBULATORY_CARE_PROVIDER_SITE_OTHER)
Admission: RE | Admit: 2020-08-02 | Discharge: 2020-08-02 | Disposition: A | Discharge status: Home / Self Care | Payer: Medicare HMO | Source: Ambulatory Visit | Attending: Cardiovascular Disease | Admitting: Cardiovascular Disease

## 2020-08-02 DIAGNOSIS — I712 Thoracic aortic aneurysm, without rupture, unspecified: Secondary | ICD-10-CM

## 2020-08-02 DIAGNOSIS — Q231 Congenital insufficiency of aortic valve: Secondary | ICD-10-CM | POA: Diagnosis present

## 2020-08-02 LAB — ECHOCARDIOGRAM COMPLETE
AR max vel: 1.79 cm2
AV Area VTI: 2.09 cm2
AV Area mean vel: 1.78 cm2
AV Mean grad: 11 mmHg
AV Peak grad: 20.1 mmHg
Ao pk vel: 2.24 m/s
Area-P 1/2: 2.24 cm2
S' Lateral: 4.2 cm

## 2020-08-02 MED ORDER — IOHEXOL 350 MG/ML SOLN
80.0000 mL | Freq: Once | INTRAVENOUS | Status: AC | PRN
  Administered 2020-08-02: 80 mL via INTRAVENOUS

## 2020-10-16 ENCOUNTER — Other Ambulatory Visit: Payer: Self-pay | Admitting: Psychiatry

## 2020-10-16 DIAGNOSIS — F411 Generalized anxiety disorder: Secondary | ICD-10-CM

## 2020-10-26 ENCOUNTER — Other Ambulatory Visit: Payer: Self-pay | Admitting: Psychiatry

## 2020-10-26 DIAGNOSIS — F411 Generalized anxiety disorder: Secondary | ICD-10-CM

## 2020-10-27 NOTE — Telephone Encounter (Signed)
Last filled 4/28 f/u due January 2023

## 2021-01-18 ENCOUNTER — Telehealth: Payer: Self-pay | Admitting: Psychiatry

## 2021-01-18 ENCOUNTER — Other Ambulatory Visit: Payer: Self-pay

## 2021-01-18 DIAGNOSIS — F411 Generalized anxiety disorder: Secondary | ICD-10-CM

## 2021-01-18 MED ORDER — LORAZEPAM 1 MG PO TABS: 1.0000 mg | ORAL_TABLET | Freq: Three times a day (TID) | ORAL | 0 refills | Status: DC

## 2021-01-18 NOTE — Telephone Encounter (Signed)
Pended.

## 2021-01-18 NOTE — Telephone Encounter (Signed)
Pt called and made an appt for dec 29 th. He needs a refill on his lorazapam. Please send to the [facility] in [place] [address]  [place]

## 2021-03-29 ENCOUNTER — Ambulatory Visit (INDEPENDENT_AMBULATORY_CARE_PROVIDER_SITE_OTHER): Payer: Medicare HMO | Admitting: Psychiatry

## 2021-03-29 ENCOUNTER — Encounter: Payer: Self-pay | Admitting: Psychiatry

## 2021-03-29 DIAGNOSIS — F411 Generalized anxiety disorder: Secondary | ICD-10-CM

## 2021-03-29 MED ORDER — ESCITALOPRAM OXALATE 10 MG PO TABS: 10.0000 mg | ORAL_TABLET | Freq: Every evening | ORAL | 3 refills | Status: DC

## 2021-03-29 MED ORDER — LORAZEPAM 1 MG PO TABS: 1.0000 mg | ORAL_TABLET | Freq: Three times a day (TID) | ORAL | 1 refills | Status: DC

## 2021-03-29 NOTE — Progress Notes (Signed)
[name] [id number] December 08, 1955 65 y.o.  Virtual Visit via Telephone Note  I connected with pt on 03/29/21 at  2:00 PM EST by telephone and verified that I am speaking with the correct person using two identifiers.   I discussed the limitations, risks, security and privacy concerns of performing an evaluation and management service by telephone and the availability of in person appointments. I also discussed with the patient that there may be a patient responsible charge related to this service. The patient expressed understanding and agreed to proceed.   I discussed the assessment and treatment plan with the patient. The patient was provided an opportunity to ask questions and all were answered. The patient agreed with the plan and demonstrated an understanding of the instructions.   The patient was advised to call back or seek an in-person evaluation if the symptoms worsen or if the condition fails to improve as anticipated.  I provided 15 minutes of non-face-to-face time during this encounter.  The patient was located at home.  The provider was located at [facility].   Lauraine Rinne, MD   Subjective:   Patient ID:  Howard Garrison is a 65 y.o. (DOB Jan 03, 1956) male.  Chief Complaint:  Chief Complaint  Patient presents with   Follow-up   Anxiety   Stress    HPI Howard Garrison presents for follow-up of anxiety.  seen September 2019.  He had reduced Lexapro from 5 mg nightly to about 2.5 mg a day.  He was given the option to increase it back to 5 mg if needed.  He also continued on lorazepam 1 mg 3 times daily and was usually taking about 2 daily.  04/13/2019 appt with noted: He's taking 5 mg BID Lexapro for 2 weeks and it's helped some.  Less rumination on the situation with his D.  the same amount of lorazepam.   Rough year with D taking GD away from them.  Been very stressed.  [name] 65 yo. Plan: No med changes  03/29/2021 appointment with  the following noted: Continues Lexapro 10 mg daily and lorazepam 1 mg 3 times daily Wife [name] Had Covid 3 mos ago leading to pneumonia.  Kicked off MS.  He's been caregiving. Son didn't respond at [facility].  D [name] still has kept herself estranged. He had mild Covid.  Patient reports stable mood and denies depressed or irritable moods.  Not sleeping as well thinking about these things. Denies appetite disturbance.  Patient reports that energy and motivation have been good.  Patient denies any difficulty with concentration.  Patient denies any suicidal ideation.   Past Psychiatric Medication Trials: Lexapro 10, Ativan 1 mg TID  Review of Systems:  Review of Systems  Cardiovascular:  Negative for chest pain and palpitations.  Neurological:  Positive for tremors. Negative for weakness.  Psychiatric/Behavioral:  Negative for agitation.    Medications: I have reviewed the patient's current medications.  Current Outpatient Medications  Medication Sig Dispense Refill   amLODipine (NORVASC) 5 MG tablet TAKE ONE TABLET BY MOUTH DAILY 90 tablet 3   atorvastatin (LIPITOR) 10 MG tablet Take 10 mg by mouth daily.     meloxicam (MOBIC) 15 MG tablet Take 15 mg by mouth daily.     ramipril (ALTACE) 10 MG capsule Take 1 capsule (10 mg total) by mouth daily. 90 capsule 3   Vitamin D, Cholecalciferol, 25 MCG (1000 UT) CAPS Take 1 capsule by mouth daily.     escitalopram (LEXAPRO)  10 MG tablet Take 1 tablet (10 mg total) by mouth every evening. 90 tablet 3   LORazepam (ATIVAN) 1 MG tablet Take 1 tablet (1 mg total) by mouth 3 (three) times daily. 270 tablet 1   sildenafil (VIAGRA) 100 MG tablet Take by mouth. (Patient not taking: Reported on 03/29/2021)     No current facility-administered medications for this visit.    Medication Side Effects: None  Allergies: No Known Allergies  Past Medical History:  Diagnosis Date   Aortic insufficiency 09/2008   Mild,/ Moderate  echo, July, 2010   Beta-blocker  intolerance    July, 2010, metoprolol   Bicuspid aortic valve 09/2008   Bicuspid aortic valve echo, July, 2010  //  cardiac CT angiogram October, 2011, bicuspid aortic valve   CAD (coronary artery disease)    Calcium score of 11, single nidus in the proximal LAD, cardiac CT angiogram  October, 2011   Chest pain 09/2008    stress echo.  [id number],9357.Marland Kitchennormal...EF % 50-55%   Dilated aortic root (HCC) 09/2008   echo...44-55mm/july, 2011...echo..45-16mm   Ejection fraction    EF 50-55%, echo, July, 2010   HTN (hypertension)    borderline   Low HDL (under 40)    LDL 110 0/1779   Lung nodule    Triangular-shaped nodule along the minor fissure stable and likely subpleural lymph node, CT scan, October 16, 2010 no further CT scans needed by report   Nephrolithiasis    Palpitations 09/2008   Holter, one short burst SVT  2 am   Prolonged Q-T interval on ECG 2010   .Marland KitchenMarland KitchenJuly, 2011   PVC (premature ventricular contraction)    S/P cholecystectomy    Viral illness 07/2008   prolonged    Family History  Problem Relation Age of Onset   Diabetes Mother    Prostate cancer Father     Social History   Socioeconomic History   Marital status: Married    Spouse name: Not on file   Number of children: Not on file   Years of education: Not on file   Highest education level: Not on file  Occupational History   Not on file  Tobacco Use   Smoking status: Never   Smokeless tobacco: Never  Substance and Sexual Activity   Alcohol use: Yes    Comment: very limited   Drug use: No   Sexual activity: Not on file  Other Topics Concern   Not on file  Social History Narrative   Not on file   Social Determinants of Health   Financial Resource Strain: Not on file  Food Insecurity: Not on file  Transportation Needs: Not on file  Physical Activity: Not on file  Stress: Not on file  Social Connections: Not on file  Intimate Partner Violence: Not on file    Past Medical History, Surgical history,  Social history, and Family history were reviewed and updated as appropriate.   Please see review of systems for further details on the patient's review from today.   Objective:   Physical Exam:  There were no vitals taken for this visit.  Physical Exam Neurological:     Mental Status: He is alert and oriented to person, place, and time.     Cranial Nerves: No dysarthria.  Psychiatric:        Attention and Perception: Attention and perception normal.        Mood and Affect: Mood is anxious and depressed.        Speech:  Speech normal.        Behavior: Behavior is cooperative.        Thought Content: Thought content normal. Thought content is not delusional. Thought content does not include homicidal or suicidal ideation.        Cognition and Memory: Cognition and memory normal.        Judgment: Judgment normal.     Comments: Insight intact    Lab Review:     Component Value Date/Time   NA 141 07/05/2020 0854   K 4.4 07/05/2020 0854   CL 107 (H) 07/05/2020 0854   CO2 21 07/05/2020 0854   GLUCOSE 121 (H) 07/05/2020 0854   BUN 31 (H) 07/05/2020 0854   CREATININE 0.97 07/05/2020 0854   CALCIUM 9.0 07/05/2020 0854   GFRNONAA 64 08/26/2018 1423   GFRAA 74 08/26/2018 1423    No results found for: WBC, RBC, HGB, HCT, PLT, MCV, MCH, MCHC, RDW, LYMPHSABS, MONOABS, EOSABS, BASOSABS  No results found for: POCLITH, LITHIUM   No results found for: PHENYTOIN, PHENOBARB, VALPROATE, CBMZ   .res Assessment: Plan:    Ulas was seen today for follow-up, anxiety and stress.  Diagnoses and all orders for this visit:  Generalized anxiety disorder -     escitalopram (LEXAPRO) 10 MG tablet; Take 1 tablet (10 mg total) by mouth every evening. -     LORazepam (ATIVAN) 1 MG tablet; Take 1 tablet (1 mg total) by mouth 3 (three) times daily.  supportive therapy dealing with trauma with D and not being able to see his GD. And also dealing with wife's problems. Disc getting help for wife.  We  discussed the short-term risks associated with benzodiazepines including sedation and increased fall risk among others.  Discussed long-term side effect risk including dependence, potential withdrawal symptoms, and the potential eventual dose-related risk of dementia.  But recent studies from 2020 dispute this association between benzodiazepines and dementia risk. Newer studies in 2020 do not support an association with dementia. Disc issues with FDA approval and lack of long terms use but he benefits.   Doing well with meds. QTC 454 05/2019 and FU Feb  Agreed continue Lexapro 10 mg daily. OK continue Ativan 1 mg TID He's satisfied.  FU 1 year  Meredith Staggers, MD, DFAPA   Please see After Visit Summary for patient specific instructions.  No future appointments.   No orders of the defined types were placed in this encounter.     -------------------------------

## 2021-04-12 ENCOUNTER — Telehealth: Payer: Self-pay | Admitting: Psychiatry

## 2021-04-12 ENCOUNTER — Other Ambulatory Visit: Payer: Self-pay | Admitting: Psychiatry

## 2021-04-12 DIAGNOSIS — F411 Generalized anxiety disorder: Secondary | ICD-10-CM

## 2021-04-12 MED ORDER — LORAZEPAM 1 MG PO TABS: 1.0000 mg | ORAL_TABLET | Freq: Three times a day (TID) | ORAL | 0 refills | Status: DC

## 2021-04-12 NOTE — Telephone Encounter (Signed)
I sent RX with note to ok early refill.  Please call pharmacy and leave message to point out I said ok early refill

## 2021-04-12 NOTE — Telephone Encounter (Signed)
Renee at [facility] LVM stating that the pt is requesting an early refill on the Lorazepam script.  By their records, he isn't due until 1/18 so they need approval from the Dr.   Pharmacy [phone] opt [phone]  No upcoming appt scheduled.

## 2021-04-12 NOTE — Telephone Encounter (Signed)
Pt is not due for a refill until 1/20.He stated on 12/28 he had a video visit with you and informed you he was taking more.He is down to 3 pills.

## 2021-04-13 NOTE — Telephone Encounter (Signed)
Spoke to pharmacy and left pt a vm

## 2021-05-22 ENCOUNTER — Other Ambulatory Visit: Payer: Self-pay | Admitting: Cardiovascular Disease

## 2021-06-20 ENCOUNTER — Other Ambulatory Visit: Payer: Self-pay | Admitting: Cardiovascular Disease

## 2021-08-01 ENCOUNTER — Telehealth: Payer: Self-pay | Admitting: Cardiovascular Disease

## 2021-08-01 NOTE — Telephone Encounter (Signed)
Pt c/o of Chest Pain: STAT if CP now or developed within 24 hours ? ?1. Are you having CP right now? no ? ?2. Are you experiencing any other symptoms (ex. SOB, nausea, vomiting, sweating)? SOB, dizziness, cold sweat ? ?3. How long have you been experiencing CP? Couple weeks ? ?4. Is your CP continuous or coming and going? Comes and goes ? ?5. Have you taken Nitroglycerin? No ? ? ?Patient states he has been having chest pain for a couple weeks. He says has also noticed some fatigue.  ??  ?

## 2021-08-01 NOTE — Telephone Encounter (Signed)
Patient calling back for [place]. ?

## 2021-08-01 NOTE — Telephone Encounter (Signed)
Returned call to Pt.  He states a few weeks ago he noticed that he began to "feel like garbage" with low energy, lethargy.  He states this is not like him, he is a Chief Strategy Officer. ? ?He states he was recently playing with his dogs when he felt like he would pass out.  He sat down to recover. ? ?He has noticed "little jabs" in his chest.  He states these are random and not related to exertion. ? ?He saw his PCP today who suggested he follow up with his cardiologist.   ? ?He states he thinks his BP has been normal. ? ?He was overdue for follow up.  Appt made for 08/03/2021. ? ?He is aware to seek emergency care for worsening symptoms. ? ? ? ? ?

## 2021-08-02 ENCOUNTER — Other Ambulatory Visit: Payer: Self-pay

## 2021-08-02 DIAGNOSIS — Q231 Congenital insufficiency of aortic valve: Secondary | ICD-10-CM

## 2021-08-02 NOTE — Progress Notes (Signed)
Patient ID: Howard Garrison, male   DOB: 10-25-1955, 66 y.o.   MRN: [id number] ? ? ? ?HPI ?66 y.o.  who has known bicuspid aortic valve with mild aortic insufficiency. He has mild dilatation of the aortic root History of palpitations and HTN ? ?Occasional palpitations and PVC  ? ?Echo 06/04/18 reviewed EF 60-65% mild AS mean gradient 13 peak 23 mmHg and trivial AR  ?Echo 08/02/20 reviewed EF 60-65% bicuspid valve Ao 4.5 cm mean gradient 11 peak 20 mmHg   ? ?CTA Chest 08/27/18 aorta stable 4.2 cm  ?CTA Chest 08/04/20 aorta 4.2 although sinus reported at 4.6  ? ?Unfortunately now estranged from daughter in [place] and has not seen his 2yo grand daughter Normand Sloop for a while r. Getting along better with bipolar son in [place] who has 2 kids ? ?Will need TKR;s when he retires  ? ?Has felt weak and fatigued this month Can feel like he's going to pass out playing with his dog Random"jabs" in his chest not exertional He was started on Augmentin for ethomoid sinus infection ?Labs done by primary reviewed and WBC, ESR,CRP TSH all normal He has not had recent dental work ?No fever but feels clammy and "feverish' ? ? ? ?No Known Allergies ? ?Current Outpatient Medications  ?Medication Sig Dispense Refill  ? amLODipine (NORVASC) 5 MG tablet Take 1 tablet (5 mg total) by mouth daily. 30 tablet 1  ? atorvastatin (LIPITOR) 10 MG tablet Take 10 mg by mouth daily.    ? escitalopram (LEXAPRO) 10 MG tablet Take 1 tablet (10 mg total) by mouth every evening. 90 tablet 3  ? LORazepam (ATIVAN) 1 MG tablet Take 1 tablet (1 mg total) by mouth 3 (three) times daily. 270 tablet 0  ? meloxicam (MOBIC) 15 MG tablet Take 15 mg by mouth daily.    ? ramipril (ALTACE) 10 MG capsule Take 1 capsule (10 mg total) by mouth daily. 30 capsule 1  ? Vitamin D, Cholecalciferol, 25 MCG (1000 UT) CAPS Take 1 capsule by mouth daily.    ? ?No current facility-administered medications for this visit.  ? ? ?Social History  ? ?Socioeconomic History  ? Marital status:  Married  ?  Spouse name: Not on file  ? Number of children: Not on file  ? Years of education: Not on file  ? Highest education level: Not on file  ?Occupational History  ? Not on file  ?Tobacco Use  ? Smoking status: Never  ? Smokeless tobacco: Never  ?Substance and Sexual Activity  ? Alcohol use: Yes  ?  Comment: very limited  ? Drug use: No  ? Sexual activity: Not on file  ?Other Topics Concern  ? Not on file  ?Social History Narrative  ? Not on file  ? ?Social Determinants of Health  ? ?Financial Resource Strain: Not on file  ?Food Insecurity: Not on file  ?Transportation Needs: Not on file  ?Physical Activity: Not on file  ?Stress: Not on file  ?Social Connections: Not on file  ?Intimate Partner Violence: Not on file  ? ? ?Family History  ?Problem Relation Age of Onset  ? Diabetes Mother   ? Prostate cancer Father   ? ? ?Past Medical History:  ?Diagnosis Date  ? Aortic insufficiency 09/2008  ? Mild,/ Moderate  echo, July, 2010  ? Beta-blocker intolerance   ? July, 2010, metoprolol  ? Bicuspid aortic valve 09/2008  ? Bicuspid aortic valve echo, July, 2010  //  cardiac CT angiogram October,  2011, bicuspid aortic valve  ? CAD (coronary artery disease)   ? Calcium score of 11, single nidus in the proximal LAD, cardiac CT angiogram  October, 2011  ? Chest pain 09/2008  ?  stress echo.  TSVX,7939.Marland Kitchennormal...EF % 50-55%  ? Dilated aortic root ([place]) 09/2008  ? echo...44-35mm/july, 2011...echo..45-28mm  ? Ejection fraction   ? EF 50-55%, echo, July, 2010  ? HTN (hypertension)   ? borderline  ? Low HDL (under 40)   ? LDL 110 09/2008  ? Lung nodule   ? Triangular-shaped nodule along the minor fissure stable and likely subpleural lymph node, CT scan, October 16, 2010 no further CT scans needed by report  ? Nephrolithiasis   ? Palpitations 09/2008  ? Holter, one short burst SVT  2 am  ? Prolonged Q-T interval on ECG 2010  ? 473ms.Marland KitchenMarland KitchenJuly, 2011  ? PVC (premature ventricular contraction)   ? S/P cholecystectomy   ? Viral illness 07/2008   ? prolonged  ? ? ?Past Surgical History:  ?Procedure Laterality Date  ? CHOLECYSTECTOMY    ? KNEE ARTHROSCOPY    ? x 4  ? ? ?Patient Active Problem List  ? Diagnosis Date Noted  ? Anxiety 02/17/2014  ? Lung nodule   ? Prolonged Q-T interval on ECG   ? PVC (premature ventricular contraction)   ? Beta-blocker intolerance   ? Low HDL (under 40)   ? Nephrolithiasis   ? HTN (hypertension)   ? S/P cholecystectomy   ? Ejection fraction   ? CAD (coronary artery disease)   ? Palpitations 09/30/2008  ? Chest pain 09/30/2008  ? Aortic insufficiency 09/30/2008  ? Bicuspid aortic valve 09/30/2008  ? Dilated aortic root ([place]) 09/30/2008  ? Viral illness 07/31/2008  ? ? ?ROS  ?Patient denies fever, chills, headache, sweats, rash, change in vision, change in hearing, cough, nausea or vomiting, urinary symptoms. All other systems are reviewed and are negative. ? ?PHYSICAL EXAM ?Affect appropriate ?Healthy:  appears stated age ?HEENT: normal ?Neck supple with no adenopathy ?JVP normal no bruits no thyromegaly ?Lungs clear with no wheezing and good diaphragmatic motion ?Heart:  S1/S2 SEM / AR  murmur, no rub, gallop or click ?PMI normal ?Abdomen: benighn, BS positve, no tenderness, no AAA ?no bruit.  No HSM or HJR ?Distal pulses intact with no bruits ?No edema ?Neuro non-focal ?Skin warm and dry ?No muscular weakness ? ? ?Vitals:  ? 08/03/21 1448  ?BP: 128/74  ?Pulse: (!) 49  ?Weight: 218 lb 9.6 oz (99.2 kg)  ?Height: $RemoveB'6\' 1"'XeJtKHTu$  (1.854 m)  ? ?  ? ?EKG  05/27/19 SR rate 63  PR 206 nonspecific ST changes 08/03/2021 SR rate 54 nonspecific ST changes  ? ?ASSESSMENT & PLAN ? ?Bicuspid AV mean gradient 12 normal EF will update echo but is not severe on exam No new AR murmur concern regarding SBE check BC;s x 2 Given his symptoms will order TAVR like gated retrospective CTA to assess aortic annulus size aneurysm and r/o any infectious process in chest  ? ?HTN:  Well controlled.  Continue current medications and low sodium Dash type diet.    ? ?Anxiety: stable f/u psychiatry. Doing well on low dose lexapro and ativan ? ?Palpitations  Benign no beta blocker given bradycardia and long PR  ? ?Aorta:  Ascending root 4.2 cm CT 08/04/20 will re evaluate with cardiac CTA  ? ?Fatigue/Malaise:   see above biggest issue r/o SBE BC;s x 2 TTE and cardiac CTA primary ?Has him on  Augmentin for sinusitis  ? ? ?Echo ?Cardiac CTA ? ?F/U  pending results  ? ? ?Jenkins [place] ? ?

## 2021-08-02 NOTE — Progress Notes (Unsigned)
Per Dr. Johnsie Cancel, get echo for bicuspid AV r/o SBE. Placed order.  ?

## 2021-08-03 ENCOUNTER — Ambulatory Visit: Payer: Medicare HMO | Admitting: Cardiovascular Disease

## 2021-08-03 ENCOUNTER — Other Ambulatory Visit: Payer: Self-pay

## 2021-08-03 ENCOUNTER — Encounter: Payer: Self-pay | Admitting: Cardiovascular Disease

## 2021-08-03 VITALS — BP 128/74 | HR 49 | Ht 73.0 in | Wt 218.6 lb

## 2021-08-03 DIAGNOSIS — Q231 Congenital insufficiency of aortic valve: Secondary | ICD-10-CM | POA: Diagnosis not present

## 2021-08-03 DIAGNOSIS — I712 Thoracic aortic aneurysm, without rupture, unspecified: Secondary | ICD-10-CM | POA: Diagnosis not present

## 2021-08-03 DIAGNOSIS — I10 Essential (primary) hypertension: Secondary | ICD-10-CM

## 2021-08-03 DIAGNOSIS — R072 Precordial pain: Secondary | ICD-10-CM

## 2021-08-03 DIAGNOSIS — Q2381 Bicuspid aortic valve: Secondary | ICD-10-CM

## 2021-08-03 NOTE — Patient Instructions (Signed)
Medication Instructions:  ?*If you need a refill on your cardiac medications before your next appointment, please call your pharmacy* ? ?Lab Work: ?Your physician recommends that you have lab work today- Blood Culture times 2 ? ?If you have labs (blood work) drawn today and your tests are completely normal, you will receive your results only by: ?MyChart Message (if you have MyChart) OR ?A paper copy in the mail ?If you have any lab test that is abnormal or we need to change your treatment, we will call you to review the results. ? ?Testing/Procedures: ?Your physician has requested that you have cardiac CT gated. Cardiac computed tomography (CT) is a painless test that uses an x-ray machine to take clear, detailed pictures of your heart. For further information please visit [email or web address] Please follow instruction sheet as given. ?  ?Your physician has requested that you have an echocardiogram. Echocardiography is a painless test that uses sound waves to create images of your heart. It provides your doctor with information about the size and shape of your heart and how well your heart?s chambers and valves are working. This procedure takes approximately one hour. There are no restrictions for this procedure. ? ?Follow-Up: ?At [facility], you and your health needs are our priority.  As part of our continuing mission to provide you with exceptional heart care, we have created designated Provider Care Teams.  These Care Teams include your primary Cardiologist (physician) and Advanced Practice Providers (APPs -  Physician Assistants and Nurse Practitioners) who all work together to provide you with the care you need, when you need it. ? ?We recommend signing up for the patient portal called "MyChart".  Sign up information is provided on this After Visit Summary.  MyChart is used to connect with patients for Virtual Visits (Telemedicine).  Patients are able to view lab/test results, encounter notes, upcoming  appointments, etc.  Non-urgent messages can be sent to your provider as well.   ?To learn more about what you can do with MyChart, go to [email or web address].   ? ?Your next appointment:   ?6 month(s) ? ?The format for your next appointment:   ?In Person ? ?Provider:   ?Charlton Haws, MD { ? ? ? ?Important Information About Sugar ? ? ? ? ?  ?

## 2021-08-03 NOTE — Addendum Note (Signed)
Addended by: Aris Georgia, Nasario Czerniak L on: 08/03/2021 03:36 PM ? ? Modules accepted: Orders ? ?

## 2021-08-03 NOTE — Addendum Note (Signed)
Addended by: Virl Axe, Jwan Hornbaker L on: 08/03/2021 03:44 PM ? ? Modules accepted: Orders ? ?

## 2021-08-09 LAB — CULTURE, BLOOD (SINGLE)

## 2021-08-17 ENCOUNTER — Ambulatory Visit (HOSPITAL_COMMUNITY): Payer: Medicare HMO | Attending: Cardiovascular Disease

## 2021-08-17 DIAGNOSIS — Q231 Congenital insufficiency of aortic valve: Secondary | ICD-10-CM | POA: Insufficient documentation

## 2021-08-18 ENCOUNTER — Telehealth (HOSPITAL_COMMUNITY): Payer: Self-pay | Admitting: Emergency Medicine

## 2021-08-18 LAB — ECHOCARDIOGRAM COMPLETE
AR max vel: 2.52 cm2
AV Area VTI: 3.01 cm2
AV Area mean vel: 2.5 cm2
AV Mean grad: 11 mmHg
AV Peak grad: 20.3 mmHg
Ao pk vel: 2.25 m/s
Area-P 1/2: 3.74 cm2
P 1/2 time: 533 msec
S' Lateral: 3.6 cm

## 2021-08-18 NOTE — Telephone Encounter (Signed)
Reaching out to patient to offer assistance regarding upcoming cardiac imaging study; pt verbalizes understanding of appt date/time, parking situation and where to check in, pre-test NPO status and medications ordered, and verified current allergies; name and call back number provided for further questions should they arise Rockwell Alexandria RN Navigator Cardiac Imaging [facility] [phone] office [phone] cell  Arrival 1230

## 2021-08-22 ENCOUNTER — Encounter: Payer: Self-pay | Admitting: Cardiovascular Disease

## 2021-08-22 ENCOUNTER — Other Ambulatory Visit (HOSPITAL_COMMUNITY): Payer: Self-pay | Admitting: *Deleted

## 2021-08-22 ENCOUNTER — Ambulatory Visit (HOSPITAL_COMMUNITY)
Admission: RE | Admit: 2021-08-22 | Discharge: 2021-08-22 | Disposition: A | Discharge status: Home / Self Care | Payer: Medicare HMO | Source: Ambulatory Visit | Attending: Cardiovascular Disease | Admitting: Cardiovascular Disease

## 2021-08-22 DIAGNOSIS — R072 Precordial pain: Secondary | ICD-10-CM

## 2021-08-22 DIAGNOSIS — Q231 Congenital insufficiency of aortic valve: Secondary | ICD-10-CM | POA: Diagnosis present

## 2021-08-22 MED ORDER — NITROGLYCERIN 0.4 MG SL SUBL
0.8000 mg | SUBLINGUAL_TABLET | Freq: Once | SUBLINGUAL | Status: AC
  Administered 2021-08-22: 0.8 mg via SUBLINGUAL

## 2021-08-22 MED ORDER — IOHEXOL 350 MG/ML SOLN
100.0000 mL | Freq: Once | INTRAVENOUS | Status: AC | PRN
  Administered 2021-08-22: 100 mL via INTRAVENOUS

## 2021-08-22 MED ORDER — NITROGLYCERIN 0.4 MG SL SUBL
SUBLINGUAL_TABLET | SUBLINGUAL | Status: AC
  Filled 2021-08-22: qty 2

## 2021-08-24 ENCOUNTER — Other Ambulatory Visit: Payer: Self-pay | Admitting: Cardiovascular Disease

## 2021-12-11 ENCOUNTER — Other Ambulatory Visit: Payer: Self-pay | Admitting: Cardiovascular Disease

## 2021-12-29 ENCOUNTER — Other Ambulatory Visit: Payer: Self-pay | Admitting: Psychiatry

## 2021-12-29 DIAGNOSIS — F411 Generalized anxiety disorder: Secondary | ICD-10-CM

## 2021-12-29 NOTE — Telephone Encounter (Signed)
Last filled 7/5 x 90 days, due 10/2

## 2022-01-31 ENCOUNTER — Telehealth: Payer: Self-pay | Admitting: Cardiovascular Disease

## 2022-01-31 NOTE — Telephone Encounter (Signed)
Called patient back about message. Patient wanted to know if it was any where in his chart that he had issue with metoprolol or propanolol. Patient stated that another doctor has prescribed him propanolol for headaches and he does not think he can take a beta blocker. Informed patient per Dr. Johnsie Cancel note "Palpitations- benign no beta blocker given bradycardia and long PR".  Patient stated this happened a long time ago when he was seeing Dr. Ron Parker. Informed patient that per Dr. Ron Parker notes on 10/03/2009 it states "PALPITATIONS (ICD-785.1).Marland KitchenMarland KitchenHolter..09/2008...one short burst  SVT.Marland Kitchen2 AM (he may have felt)..tried Lopressor..did not tolerate  Beta Blocker intolerance   metoprolol".  Patient verbalized understanding and thanked [place] for the help.

## 2022-01-31 NOTE — Telephone Encounter (Signed)
Pt c/o medication issue:  1. Name of Medication:   Propanolol or Metoprolol  2. How are you currently taking this medication (dosage and times per day)?   Not taking now  3. Are you having a reaction (difficulty breathing--STAT)?   Headache  4. What is your medication issue?   Patient would like confirm he was on a beta blocker years ago which he believes caused him to have headaches.

## 2022-02-09 ENCOUNTER — Ambulatory Visit: Payer: Medicare HMO | Admitting: Cardiovascular Disease

## 2022-02-15 NOTE — Progress Notes (Addendum)
Office Visit    Patient Name: Howard Garrison Date of Encounter: 02/16/2022  PCP:  [facility]  Cardiologist:  Charlton Haws, MD  Advanced Practice Provider:  No care team member to display Electrophysiologist:  None   HPI    Howard Garrison is a 66 y.o. male with past medical history significant for aortic insufficiency, mild dilatation of the aortic root, palpitations, and hypertension presents today for 58-month follow-up appointment.  He was last seen in the clinic 08/03/2021 by Dr. Eden Emms.  History includes occasional palpitations a.m. PVCs.  Echocardiogram 06/04/2018 with LVEF 60 to 65%, mild AS with mean gradient 13 peak 23 mmHg and trivial AR.  Echo 08/02/2020 showed EF 66 5%, bicuspid valve Ao 4.5 cm mean gradient 11 peak 20 mmHg.  He was last seen, he was having some fatigue.  He felt he was going to pass out playing with his dog.  He had random jabs in his chest which is nonexertional.  He been started on Augmentin for sinus infection.  Labs done by primary were all normal.  Blood cultures were ordered as well as a CTA to evaluate for any infectious process in the chest.  His CTA showed nonobstructive CAD, bicuspid AV severs 1 raphe between left and right cusps, AVA 2.2 cm.  Calcium score 1886.  Ascending thoracic aortic aneurysm measuring 4.3 cm with dilated sinus 4.6 cm.  No evidence of coarctation or SBE/vegetations.  Overall, reassuring scan.  Today, he has been feeling good.  He was recently started gabapentin for headaches and has made huge difference.  He has not had shortness of breath or chest pain.  He is no longer taking regular but he does walk quite a bit with his [place] puppy.  He feels like his energy has improved.  We discussed watching his blood pressure closely due to his ascending aortic aneurysm.  Blood pressure is well controlled today at 128/72.  Reports no shortness of breath nor dyspnea on exertion. Reports no  chest pain, pressure, or tightness. No edema, orthopnea, PND. Reports no palpitations.   Past Medical History    Past Medical History:  Diagnosis Date   Aortic insufficiency 09/2008   Mild,/ Moderate  echo, July, 2010   Beta-blocker intolerance    July, 2010, metoprolol   Bicuspid aortic valve 09/2008   Bicuspid aortic valve echo, July, 2010  //  cardiac CT angiogram October, 2011, bicuspid aortic valve   CAD (coronary artery disease)    Calcium score of 11, single nidus in the proximal LAD, cardiac CT angiogram  October, 2011   Chest pain 09/2008    stress echo.  IPJA,2505.Marland Kitchennormal...EF % 50-55%   Dilated aortic root (HCC) 09/2008   echo...44-49mm/july, 2011...echo..45-22mm   Ejection fraction    EF 50-55%, echo, July, 2010   HTN (hypertension)    borderline   Low HDL (under 40)    LDL 110 05/9765   Lung nodule    Triangular-shaped nodule along the minor fissure stable and likely subpleural lymph node, CT scan, October 16, 2010 no further CT scans needed by report   Nephrolithiasis    Palpitations 09/2008   Holter, one short burst SVT  2 am   Prolonged Q-T interval on ECG 2010   .Marland KitchenMarland KitchenJuly, 2011   PVC (premature ventricular contraction)    S/P cholecystectomy    Viral illness 07/2008   prolonged   Past Surgical History:  Procedure Laterality Date  CHOLECYSTECTOMY     KNEE ARTHROSCOPY     x 4    Allergies  Allergies  Allergen Reactions   Metoprolol Other (See Comments)    Metoprolol (lopressor)    EKGs/Labs/Other Studies Reviewed:   The following studies were reviewed today:  Coronary CTA 08/22/2021  Narrative & Impression  CLINICAL DATA:  Bicuspid AV/ Aortic aneurysm   EXAM: Cardiac CTA   MEDICATIONS: Sub lingual nitro.  4mg    TECHNIQUE: The patient was scanned on a Siemens Force [id number] slice scanner. Gantry rotation speed was 250 msecs. Collimation was .6 mm. A 100 kV retrospective scan was triggered in the ascending thoracic aorta at 140 HU's. Average HR  during the scan was 50 bpm. The 3D data set was interpreted on a dedicated work station using MPR, MIP and VRT modes. A total of 80 cc of contrast was used.   FINDINGS: Non-cardiac: See separate report from [facility]. No significant findings on limited lung and soft tissue windows.   Calcium Score: Calcium noted in the LAD/RCA   LM: 0   LAD 77.6   RCA 22.5   LCX 0   Total: 100 which is 25 th percentile for age/sex   Coronary Arteries: Right dominant with no anomalies   LM: Normal   LAD: 1-24% calcified plaque in ostial LAD 1-24% mixed plaque in proximal LAD   D1: Normal   D2: Normal   Circumflex: Normal   OM1: Normal   OM2: Normal   RCA: 1-24% calcified plaque in proximal vessel   PDA: Normal   PLA: Normal   Aortic Valve: Calcium score 1886 Bicuspid Sievers 1 with raphe between left and right cusps calcified with calcification of leaflet edges as well AVA by planimetry 2.2 cm2   Dilated ascending thoracic aorta 4.3 cm in orthogonal planes No vegetations or SBE noted Raphe to non coronary sinus 4.6 cm Commissural sinus 3.7 cm Bovine Arch No coarctation No significant aortic atherosclerosis   IMPRESSION: 1. CAD RADS 1 non obstructive CAD see description above   2. Bicuspid AV Sievers 1 Raphe between left and right cusps AVA planimetry 2.2 cm2 Calcium score 1886   3. Ascending thoracic aortic aneurysm measuring 4.3 cm with dilated sinus 4.6 cm   4.  No evidence of coarctation or SBE/vegetations   Jenkins Rouge   Electronically Signed: By: Jenkins Rouge M.D. On: 08/22/2021 15:29     EKG:  EKG is not ordered today.   Recent Labs: No results found for requested labs within last 365 days.  Recent Lipid Panel    Component Value Date/Time   CHOL 163 10/25/2008 0946   TRIG 97.0 10/25/2008 0946   HDL 32.60 (L) 10/25/2008 0946   CHOLHDL 5 10/25/2008 0946   VLDL 19.4 10/25/2008 0946   LDLCALC 111 (H) 10/25/2008 0946    Home Medications    Current Meds  Medication Sig   amLODipine (NORVASC) 5 MG tablet TAKE ONE TABLET BY MOUTH DAILY   atorvastatin (LIPITOR) 10 MG tablet Take 10 mg by mouth daily.   escitalopram (LEXAPRO) 10 MG tablet Take 1 tablet (10 mg total) by mouth every evening.   gabapentin (NEURONTIN) 100 MG capsule Take 100 mg by mouth 3 (three) times daily.   LORazepam (ATIVAN) 1 MG tablet TAKE ONE TABLET BY MOUTH THREE TIMES A DAY   meloxicam (MOBIC) 15 MG tablet Take 15 mg by mouth daily.   ramipril (ALTACE) 10 MG capsule Take 1 capsule (10 mg total) by mouth daily.  Please keep November appointment for future refills. Thank you.   Vitamin D, Cholecalciferol, 25 MCG (1000 UT) CAPS Take 1 capsule by mouth daily.     Review of Systems      All other systems reviewed and are otherwise negative except as noted above.  Physical Exam    VS:  BP 128/72   Pulse (!) 58   Ht 6\' 1"  (1.854 m)   Wt 216 lb (98 kg)   BMI 28.50 kg/m  , BMI Body mass index is 28.5 kg/m.  Wt Readings from Last 3 Encounters:  02/16/22 216 lb (98 kg)  08/03/21 218 lb 9.6 oz (99.2 kg)  07/05/20 216 lb (98 kg)     GEN: Well nourished, well developed, in no acute distress. HEENT: normal. Neck: Supple, no JVD, carotid bruits, or masses. Cardiac: RRR, grade 4/6 systolic murmur, rubs, or gallops. No clubbing, cyanosis, edema.  Radials/PT 2+ and equal bilaterally.  Respiratory:  Respirations regular and unlabored, clear to auscultation bilaterally. GI: Soft, nontender, nondistended. MS: No deformity or atrophy. Skin: Warm and dry, no rash. Neuro:  Strength and sensation are intact. Psych: Normal affect.  Assessment & Plan    Bicuspid aortic valve -Reviewed most recent echocardiogram with the patient -He is currently asymptomatic -continue current medication regimen -echo due 5/24  Hypertension -well controlled today in the clinic -Continue amlodipine 5 mg daily  Palpitations -resolved  Ascending aortic aneurysm 4.2 cm,  stable -Continue current blood pressure medication regimen for good control -echo due 5/24      Disposition: Follow up 6 months with Jenkins Rouge, MD or APP.  Signed, Elgie Collard, PA-C 02/16/2022, 1:36 PM [facility]

## 2022-02-16 ENCOUNTER — Ambulatory Visit: Payer: Medicare HMO | Attending: Physician Assistant | Admitting: Physician Assistant

## 2022-02-16 ENCOUNTER — Encounter: Payer: Self-pay | Admitting: Physician Assistant

## 2022-02-16 VITALS — BP 128/72 | HR 58 | Ht 73.0 in | Wt 216.0 lb

## 2022-02-16 DIAGNOSIS — R002 Palpitations: Secondary | ICD-10-CM | POA: Diagnosis not present

## 2022-02-16 DIAGNOSIS — Q231 Congenital insufficiency of aortic valve: Secondary | ICD-10-CM

## 2022-02-16 DIAGNOSIS — I10 Essential (primary) hypertension: Secondary | ICD-10-CM

## 2022-02-16 DIAGNOSIS — R5383 Other fatigue: Secondary | ICD-10-CM

## 2022-02-16 DIAGNOSIS — I7121 Aneurysm of the ascending aorta, without rupture: Secondary | ICD-10-CM | POA: Diagnosis not present

## 2022-02-16 NOTE — Patient Instructions (Signed)
Medication Instructions:  Your physician recommends that you continue on your current medications as directed. Please refer to the Current Medication list given to you today.  *If you need a refill on your cardiac medications before your next appointment, please call your pharmacy*   Lab Work: None If you have labs (blood work) drawn today and your tests are completely normal, you will receive your results only by: MyChart Message (if you have MyChart) OR A paper copy in the mail If you have any lab test that is abnormal or we need to change your treatment, we will call you to review the results.   Follow-Up: At  [facility]Care, you and your health needs are our priority.  As part of our continuing mission to provide you with exceptional heart care, we have created designated Provider Care Teams.  These Care Teams include your primary Cardiologist (physician) and Advanced Practice Providers (APPs -  Physician Assistants and Nurse Practitioners) who all work together to provide you with the care you need, when you need it.  Your next appointment:   6 month(s)  The format for your next appointment:   In Person  Provider:   Peter Nishan, MD    Important Information About Sugar       

## 2022-03-15 ENCOUNTER — Other Ambulatory Visit: Payer: Self-pay | Admitting: Cardiovascular Disease

## 2022-04-15 ENCOUNTER — Other Ambulatory Visit: Payer: Self-pay | Admitting: Psychiatry

## 2022-04-15 DIAGNOSIS — F411 Generalized anxiety disorder: Secondary | ICD-10-CM

## 2022-04-15 MED ORDER — LORAZEPAM 1 MG PO TABS: ORAL_TABLET | ORAL | 1 refills | Status: DC

## 2022-04-15 NOTE — Progress Notes (Signed)
Pt having EMA.  PCP suggested simplest option to increase lorazepam HS.  Disc trying 1.5 mg HS and if not effective use 2 mg HS and the 1 mg BID.  LED. We discussed the short-term risks associated with benzodiazepines including sedation and increased fall risk among others.  Discussed long-term side effect risk including dependence, potential withdrawal symptoms, and the potential eventual dose-related risk of dementia.  But recent studies from 2020 dispute this association between benzodiazepines and dementia risk. Newer studies in 2020 do not support an association with dementia.

## 2022-05-16 ENCOUNTER — Other Ambulatory Visit: Payer: Self-pay | Admitting: Psychiatry

## 2022-05-16 DIAGNOSIS — F411 Generalized anxiety disorder: Secondary | ICD-10-CM

## 2022-07-18 ENCOUNTER — Ambulatory Visit: Payer: Medicare HMO | Admitting: Psychology

## 2022-07-29 NOTE — Progress Notes (Deleted)
Patient ID: Howard Garrison, male   DOB: Aug 29, 1955, 67 y.o.   MRN: [id number]    HPI 67 y.o.  who has known bicuspid aortic valve with mild aortic insufficiency. He has mild dilatation of the aortic root History of palpitations and HTN  Occasional palpitations and PVC   Echo 06/04/18 reviewed EF 60-65% mild AS mean gradient 13 peak 23 mmHg and trivial AR  Echo 08/02/20 reviewed EF 60-65% bicuspid valve Ao 4.5 cm mean gradient 11 peak 20 mmHg    CTA Chest 08/27/18 aorta stable 4.2 cm  CTA Chest 08/04/20 aorta 4.2 although sinus reported at 4.6   Unfortunately now estranged from daughter in [place] and has not seen his 34 yo grand daughter [place] Livings for a while Getting along better with bipolar son in [place] who has 2 kids  Will need TKR;s when he retires   Echo 08/18/21 EF 60-65% mild LVE/LVH Bicuspid valve with mild/mod AR and mild AS mean gradient 12 peak 20.2 mMHg DVI 0.40 Aorta 4.7 cm  Gated cardiac CTA with calcium score 100 , 48 th percentile 1-24% stenosis in LAD and RCA Bicuspid AV calcium score 1886 raphe between left and right cusp Sievers type 1 Non coronary sinus 4.6 cm and ascending thoracic aorta only 4.3 cm no coarctation    ***     Allergies  Allergen Reactions   Metoprolol Other (See Comments)    Metoprolol (lopressor)    Current Outpatient Medications  Medication Sig Dispense Refill   amLODipine (NORVASC) 5 MG tablet TAKE ONE TABLET BY MOUTH DAILY 90 tablet 3   atorvastatin (LIPITOR) 10 MG tablet Take 10 mg by mouth daily.     escitalopram (LEXAPRO) 10 MG tablet TAKE ONE TABLET BY MOUTH EVERY EVENING 90 tablet 3   gabapentin (NEURONTIN) 100 MG capsule Take 100 mg by mouth 3 (three) times daily.     LORazepam (ATIVAN) 1 MG tablet 1 tablet AM and 2 PM and 2 tablets at night 360 tablet 1   meloxicam (MOBIC) 15 MG tablet Take 15 mg by mouth daily.     ramipril (ALTACE) 10 MG capsule Take 1 capsule (10 mg total) by mouth daily. 90 capsule 3   Vitamin D, Cholecalciferol, 25  MCG (1000 UT) CAPS Take 1 capsule by mouth daily.     No current facility-administered medications for this visit.    Social History   Socioeconomic History   Marital status: Married    Spouse name: Not on file   Number of children: Not on file   Years of education: Not on file   Highest education level: Not on file  Occupational History   Not on file  Tobacco Use   Smoking status: Never   Smokeless tobacco: Never  Substance and Sexual Activity   Alcohol use: Yes    Comment: very limited   Drug use: No   Sexual activity: Not on file  Other Topics Concern   Not on file  Social History Narrative   Not on file   Social Determinants of Health   Financial Resource Strain: Not on file  Food Insecurity: Not on file  Transportation Needs: Not on file  Physical Activity: Not on file  Stress: Not on file  Social Connections: Not on file  Intimate Partner Violence: Not on file    Family History  Problem Relation Age of Onset   Diabetes Mother    Prostate cancer Father     Past Medical History:  Diagnosis Date  Aortic insufficiency 09/2008   Mild,/ Moderate  echo, July, 2010   Beta-blocker intolerance    July, 2010, metoprolol   Bicuspid aortic valve 09/2008   Bicuspid aortic valve echo, July, 2010  //  cardiac CT angiogram October, 2011, bicuspid aortic valve   CAD (coronary artery disease)    Calcium score of 11, single nidus in the proximal LAD, cardiac CT angiogram  October, 2011   Chest pain 09/2008    stress echo.  WUJW,1191.Marland Kitchennormal...EF % 50-55%   Dilated aortic root (HCC) 09/2008   echo...44-1mm/july, 2011...echo..45-83mm   Ejection fraction    EF 50-55%, echo, July, 2010   HTN (hypertension)    borderline   Low HDL (under 40)    LDL 110 07/7827   Lung nodule    Triangular-shaped nodule along the minor fissure stable and likely subpleural lymph node, CT scan, October 16, 2010 no further CT scans needed by report   Nephrolithiasis    Palpitations 09/2008    Holter, one short burst SVT  2 am   Prolonged Q-T interval on ECG 2010   .Marland KitchenMarland KitchenJuly, 2011   PVC (premature ventricular contraction)    S/P cholecystectomy    Viral illness 07/2008   prolonged    Past Surgical History:  Procedure Laterality Date   CHOLECYSTECTOMY     KNEE ARTHROSCOPY     x 4    Patient Active Problem List   Diagnosis Date Noted   Anxiety 02/17/2014   Lung nodule    Prolonged Q-T interval on ECG    PVC (premature ventricular contraction)    Beta-blocker intolerance    Low HDL (under 40)    Nephrolithiasis    HTN (hypertension)    S/P cholecystectomy    Ejection fraction    CAD (coronary artery disease)    Palpitations 09/30/2008   Chest pain 09/30/2008   Aortic insufficiency 09/30/2008   Bicuspid aortic valve 09/30/2008   Dilated aortic root (HCC) 09/30/2008   Viral illness 07/31/2008    ROS  Patient denies fever, chills, headache, sweats, rash, change in vision, change in hearing, cough, nausea or vomiting, urinary symptoms. All other systems are reviewed and are negative.  There were no vitals taken for this visit.   PHYSICAL EXAM Affect appropriate Healthy:  appears stated age HEENT: normal Neck supple with no adenopathy JVP normal no bruits no thyromegaly Lungs clear with no wheezing and good diaphragmatic motion Heart:  S1/S2 SEM / AR  murmur, no rub, gallop or click PMI normal Abdomen: benighn, BS positve, no tenderness, no AAA no bruit.  No HSM or HJR Distal pulses intact with no bruits No edema Neuro non-focal Skin warm and dry No muscular weakness   EKG  05/27/19 SR rate 63  PR 206 nonspecific ST changes 07/29/2022 SR rate 54 nonspecific ST changes   ASSESSMENT & PLAN  Bicuspid AV  Sievers type 1 with mild/moderate AR and mild AS mean gradient 11 peak 20 mmhg F/U TTE May 2024   HTN:  Well controlled.  Continue current medications and low sodium Dash type diet.    Anxiety: stable f/u psychiatry. Doing well on low dose lexapro  and ativan  Palpitations  Benign no beta blocker given bradycardia and long PR   Aorta:  Ascending Aorta 4.2 cm and NCS 4.6 by gated cardiac CTA 08/22/21     Echo this month    F/U  in a year    [employer]

## 2022-08-01 ENCOUNTER — Ambulatory Visit: Payer: Medicare HMO | Admitting: Psychology

## 2022-08-02 ENCOUNTER — Ambulatory Visit: Payer: Medicare HMO | Admitting: Cardiovascular Disease

## 2022-09-20 ENCOUNTER — Other Ambulatory Visit: Payer: Self-pay | Admitting: Cardiovascular Disease

## 2022-09-25 ENCOUNTER — Encounter: Payer: Self-pay | Admitting: Cardiovascular Disease

## 2022-10-15 ENCOUNTER — Other Ambulatory Visit: Payer: Self-pay | Admitting: Psychiatry

## 2022-10-15 DIAGNOSIS — F411 Generalized anxiety disorder: Secondary | ICD-10-CM

## 2022-11-12 ENCOUNTER — Telehealth: Payer: Self-pay | Admitting: Physician Assistant

## 2022-11-12 DIAGNOSIS — Q231 Congenital insufficiency of aortic valve: Secondary | ICD-10-CM

## 2022-11-12 NOTE — Telephone Encounter (Signed)
  Pt would like to know if he needs to get an echo prior his yearly f/u

## 2022-11-13 NOTE — Telephone Encounter (Signed)
Called patient and let him know that Dr. Eden Emms is okay with him having an echo. Placed order.

## 2022-11-30 ENCOUNTER — Encounter (HOSPITAL_COMMUNITY): Payer: Self-pay

## 2022-11-30 ENCOUNTER — Ambulatory Visit (HOSPITAL_COMMUNITY): Payer: Medicare HMO

## 2023-01-10 ENCOUNTER — Ambulatory Visit (HOSPITAL_COMMUNITY): Payer: Medicare HMO | Attending: Cardiology

## 2023-01-10 DIAGNOSIS — I2510 Atherosclerotic heart disease of native coronary artery without angina pectoris: Secondary | ICD-10-CM | POA: Diagnosis not present

## 2023-01-10 DIAGNOSIS — I351 Nonrheumatic aortic (valve) insufficiency: Secondary | ICD-10-CM | POA: Insufficient documentation

## 2023-01-10 DIAGNOSIS — I10 Essential (primary) hypertension: Secondary | ICD-10-CM | POA: Insufficient documentation

## 2023-01-10 DIAGNOSIS — R079 Chest pain, unspecified: Secondary | ICD-10-CM | POA: Diagnosis not present

## 2023-01-10 DIAGNOSIS — I493 Ventricular premature depolarization: Secondary | ICD-10-CM | POA: Insufficient documentation

## 2023-01-10 DIAGNOSIS — Q2381 Bicuspid aortic valve: Secondary | ICD-10-CM | POA: Diagnosis present

## 2023-01-10 DIAGNOSIS — I352 Nonrheumatic aortic (valve) stenosis with insufficiency: Secondary | ICD-10-CM

## 2023-01-10 LAB — ECHOCARDIOGRAM COMPLETE
AR max vel: 1.92 cm2
AV Area VTI: 2.04 cm2
AV Area mean vel: 1.84 cm2
AV Mean grad: 13.2 mm[Hg]
AV Peak grad: 23.2 mm[Hg]
Ao pk vel: 2.41 m/s
Area-P 1/2: 2.66 cm2
P 1/2 time: 335 ms
S' Lateral: 4 cm

## 2023-02-12 ENCOUNTER — Encounter: Payer: Self-pay | Admitting: Physician Assistant

## 2023-02-12 ENCOUNTER — Ambulatory Visit: Payer: Medicare HMO | Admitting: Cardiovascular Disease

## 2023-02-12 ENCOUNTER — Ambulatory Visit: Payer: Medicare HMO | Attending: Physician Assistant | Admitting: Physician Assistant

## 2023-02-12 VITALS — BP 148/84 | HR 49 | Ht 73.0 in | Wt 214.2 lb

## 2023-02-12 DIAGNOSIS — Q2381 Bicuspid aortic valve: Secondary | ICD-10-CM

## 2023-02-12 DIAGNOSIS — E785 Hyperlipidemia, unspecified: Secondary | ICD-10-CM

## 2023-02-12 DIAGNOSIS — I10 Essential (primary) hypertension: Secondary | ICD-10-CM | POA: Diagnosis not present

## 2023-02-12 DIAGNOSIS — I712 Thoracic aortic aneurysm, without rupture, unspecified: Secondary | ICD-10-CM | POA: Diagnosis not present

## 2023-02-12 MED ORDER — RAMIPRIL 10 MG PO CAPS: 10.0000 mg | ORAL_CAPSULE | Freq: Every day | ORAL | 3 refills | Status: DC

## 2023-02-12 MED ORDER — ATORVASTATIN CALCIUM 10 MG PO TABS: 10.0000 mg | ORAL_TABLET | Freq: Every day | ORAL | 3 refills | Status: DC

## 2023-02-12 MED ORDER — AMLODIPINE BESYLATE 5 MG PO TABS: 5.0000 mg | ORAL_TABLET | Freq: Every day | ORAL | 3 refills | Status: DC

## 2023-02-12 NOTE — Patient Instructions (Signed)
Medication Instructions:  Your physician recommends that you continue on your current medications as directed. Please refer to the Current Medication list given to you today. *If you need a refill on your cardiac medications before your next appointment, please call your pharmacy*   Lab Work: None ordered If you have labs (blood work) drawn today and your tests are completely normal, you will receive your results only by: MyChart Message (if you have MyChart) OR A paper copy in the mail If you have any lab test that is abnormal or we need to change your treatment, we will call you to review the results.   Testing/Procedures: None ordered   Follow-Up: At [facility], you and your health needs are our priority.  As part of our continuing mission to provide you with exceptional heart care, we have created designated Provider Care Teams.  These Care Teams include your primary Cardiologist (physician) and Advanced Practice Providers (APPs -  Physician Assistants and Nurse Practitioners) who all work together to provide you with the care you need, when you need it.  We recommend signing up for the patient portal called "MyChart".  Sign up information is provided on this After Visit Summary.  MyChart is used to connect with patients for Virtual Visits (Telemedicine).  Patients are able to view lab/test results, encounter notes, upcoming appointments, etc.  Non-urgent messages can be sent to your provider as well.   To learn more about what you can do with MyChart, go to [email or web address].    Your next appointment:   12 month(s)  Provider:   Charlton Haws, MD   Other Instructions

## 2023-02-12 NOTE — Progress Notes (Signed)
Office Visit    Patient Name: Howard Garrison Date of Encounter: 02/12/2023  PCP:  [facility]  Cardiologist:  Charlton Haws, MD  Advanced Practice Provider:  No care team member to display Electrophysiologist:  None   HPI    Howard Garrison is a 67 y.o. male with past medical history significant for aortic insufficiency, mild dilatation of the aortic root, palpitations, and hypertension presents today for 58-month follow-up appointment.  He was last seen in the clinic 08/03/2021 by Dr. Eden Emms.  History includes occasional palpitations a.m. PVCs.  Echocardiogram 06/04/2018 with LVEF 60 to 65%, mild AS with mean gradient 13 peak 23 mmHg and trivial AR.  Echo 08/02/2020 showed EF 66 5%, bicuspid valve Ao 4.5 cm mean gradient 11 peak 20 mmHg.  He was last seen, he was having some fatigue.  He felt he was going to pass out playing with his dog.  He had random jabs in his chest which is nonexertional.  He been started on Augmentin for sinus infection.  Labs done by primary were all normal.  Blood cultures were ordered as well as a CTA to evaluate for any infectious process in the chest.  His CTA showed nonobstructive CAD, bicuspid AV severs 1 raphe between left and right cusps, AVA 2.2 cm.  Calcium score 1886.  Ascending thoracic aortic aneurysm measuring 4.3 cm with dilated sinus 4.6 cm.  No evidence of coarctation or SBE/vegetations.  Overall, reassuring scan.  He was seen by me 02/16/22,  he has been feeling good.  He was recently started gabapentin for headaches and has made huge difference.  He has not had shortness of breath or chest pain.  He is no longer taking regular but he does walk quite a bit with his [place] Shepherd puppy.  He feels like his energy has improved.  We discussed watching his blood pressure closely due to his ascending aortic aneurysm.  Blood pressure is well controlled today at 128/72.  Today, he presents with a history of bicuspid  aortic valve, for an annual check-up. He reports feeling great overall, but his blood pressure was noted to be high during the visit. He has been taking his amlodipine regularly and his blood pressure has been well controlled at home. He regularly checks his blood pressure at home and it is usually in the 120s over 80s.  The patient has a history of being an athlete and continues to be active, walking and running with his [place] daily. He used to race bicycles but stopped after being diagnosed with a bicuspid aortic valve. He has a slow heart rate, which is consistent with his athletic history. He has no symptoms of fatigue, lightheadedness, or dizziness.  He also mentions a persistent headache for the past three days, which he attributes to possible allergies or changes in the weather.  Reports no shortness of breath nor dyspnea on exertion. Reports no chest pain, pressure, or tightness. No edema, orthopnea, PND. Reports no palpitations.   Discussed the use of AI scribe software for clinical note transcription with the patient, who gave verbal consent to proceed.  Past Medical History    Past Medical History:  Diagnosis Date   Aortic insufficiency 09/2008   Mild,/ Moderate  echo, July, 2010   Beta-blocker intolerance    July, 2010, metoprolol   Bicuspid aortic valve 09/2008   Bicuspid aortic valve echo, July, 2010  //  cardiac CT angiogram October, 2011, bicuspid  aortic valve   CAD (coronary artery disease)    Calcium score of 11, single nidus in the proximal LAD, cardiac CT angiogram  October, 2011   Chest pain 09/2008    stress echo.  WUJW,1191.Marland Kitchennormal...EF % 50-55%   Dilated aortic root (HCC) 09/2008   echo...44-4mm/july, 2011...echo..45-26mm   Ejection fraction    EF 50-55%, echo, July, 2010   HTN (hypertension)    borderline   Low HDL (under 40)    LDL 110 07/7827   Lung nodule    Triangular-shaped nodule along the minor fissure stable and likely subpleural lymph node, CT  scan, October 16, 2010 no further CT scans needed by report   Nephrolithiasis    Palpitations 09/2008   Holter, one short burst SVT  2 am   Prolonged Q-T interval on ECG 2010   .Marland KitchenMarland KitchenJuly, 2011   PVC (premature ventricular contraction)    S/P cholecystectomy    Viral illness 07/2008   prolonged   Past Surgical History:  Procedure Laterality Date   CHOLECYSTECTOMY     KNEE ARTHROSCOPY     x 4    Allergies  Allergies  Allergen Reactions   Metoprolol Other (See Comments)    Metoprolol (lopressor)    EKGs/Labs/Other Studies Reviewed:   The following studies were reviewed today:  Coronary CTA 08/22/2021  Narrative & Impression  CLINICAL DATA:  Bicuspid AV/ Aortic aneurysm   EXAM: Cardiac CTA   MEDICATIONS: Sub lingual nitro.  4mg    TECHNIQUE: The patient was scanned on a Siemens Force 192 slice scanner. Gantry rotation speed was 250 msecs. Collimation was .6 mm. A 100 kV retrospective scan was triggered in the ascending thoracic aorta at 140 HU's. Average HR during the scan was 50 bpm. The 3D data set was interpreted on a dedicated work station using MPR, MIP and VRT modes. A total of 80 cc of contrast was used.   FINDINGS: Non-cardiac: See separate report from [facility]. No significant findings on limited lung and soft tissue windows.   Calcium Score: Calcium noted in the LAD/RCA   LM: 0   LAD 77.6   RCA 22.5   LCX 0   Total: 100 which is 48 th percentile for age/sex   Coronary Arteries: Right dominant with no anomalies   LM: Normal   LAD: 1-24% calcified plaque in ostial LAD 1-24% mixed plaque in proximal LAD   D1: Normal   D2: Normal   Circumflex: Normal   OM1: Normal   OM2: Normal   RCA: 1-24% calcified plaque in proximal vessel   PDA: Normal   PLA: Normal   Aortic Valve: Calcium score 1886 Bicuspid Sievers 1 with raphe between left and right cusps calcified with calcification of leaflet edges as well AVA by planimetry  2.2 cm2   Dilated ascending thoracic aorta 4.3 cm in orthogonal planes No vegetations or SBE noted Raphe to non coronary sinus 4.6 cm Commissural sinus 3.7 cm Bovine Arch No coarctation No significant aortic atherosclerosis   IMPRESSION: 1. CAD RADS 1 non obstructive CAD see description above   2. Bicuspid AV Sievers 1 Raphe between left and right cusps AVA planimetry 2.2 cm2 Calcium score 1886   3. Ascending thoracic aortic aneurysm measuring 4.3 cm with dilated sinus 4.6 cm   4.  No evidence of coarctation or SBE/vegetations   Charlton Haws   Electronically Signed: By: Charlton Haws M.D. On: 08/22/2021 15:29      LABS Cr: 0.74 A1c: 5.9 Hb: 12.8 LDL:  78 HDL: 35 Triglycerides: 90   EKG:  EKG is not ordered today.   Recent Labs: No results found for requested labs within last 365 days.  Recent Lipid Panel    Component Value Date/Time   CHOL 163 10/25/2008 0946   TRIG 97.0 10/25/2008 0946   HDL 32.60 (L) 10/25/2008 0946   CHOLHDL 5 10/25/2008 0946   VLDL 19.4 10/25/2008 0946   LDLCALC 111 (H) 10/25/2008 0946    Home Medications   Current Meds  Medication Sig   amLODipine (NORVASC) 5 MG tablet Take 1 tablet (5 mg total) by mouth daily. Please keep scheduled appointment for future refills. Thank you.   atorvastatin (LIPITOR) 10 MG tablet Take 10 mg by mouth daily.   escitalopram (LEXAPRO) 10 MG tablet TAKE ONE TABLET BY MOUTH EVERY EVENING   gabapentin (NEURONTIN) 100 MG capsule Take 100 mg by mouth 3 (three) times daily.   LORazepam (ATIVAN) 1 MG tablet TAKE 1 TABLET BY MOUTH EVERY MORNING AND AT 2:00PM AND 2 TABLETS AT NIGHT   meloxicam (MOBIC) 15 MG tablet Take 15 mg by mouth daily.   ramipril (ALTACE) 10 MG capsule Take 1 capsule (10 mg total) by mouth daily.   Vitamin D, Cholecalciferol, 25 MCG (1000 UT) CAPS Take 1 capsule by mouth daily.     Review of Systems      All other systems reviewed and are otherwise negative except as noted  above.  Physical Exam    VS:  BP (!) 148/84   Pulse (!) 49   Ht 6\' 1"  (1.854 m)   Wt 214 lb 3.2 oz (97.2 kg)   SpO2 96%   BMI 28.26 kg/m  , BMI Body mass index is 28.26 kg/m.  Wt Readings from Last 3 Encounters:  02/12/23 214 lb 3.2 oz (97.2 kg)  02/16/22 216 lb (98 kg)  08/03/21 218 lb 9.6 oz (99.2 kg)     GEN: Well nourished, well developed, in no acute distress. HEENT: normal. Neck: Supple, no JVD, carotid bruits, or masses. Cardiac: RRR, grade 4/6 systolic murmur, rubs, or gallops. No clubbing, cyanosis, edema.  Radials/PT 2+ and equal bilaterally.  Respiratory:  Respirations regular and unlabored, clear to auscultation bilaterally. GI: Soft, nontender, nondistended. MS: No deformity or atrophy. Skin: Warm and dry, no rash. Neuro:  Strength and sensation are intact. Psych: Normal affect.  Assessment & Plan    Bicuspid Aortic Valve with Moderate Aortic Regurgitation and Mild Aortic Stenosis Stable on recent echocardiogram. No symptoms of fatigue, lightheadedness, or dizziness. -Continue current management and follow-up with annual echocardiogram. -Ascending aortic aneurysm 4.2cm  Hypertension Blood pressure elevated in office, but patient reports normal readings at home. Currently on Amlodipine. -Advise patient to monitor blood pressure at home and report if consistently elevated. -Elevated in the clinic, 148/84 -On recheck it was 148/78 -We have encouraged him to continue to check his blood pressure at home and if it remains high to please call our office or send a MyChart message -Continue low-sodium, heart healthy diet -Continue amlodipine 5 mg daily  Thoracic Aortic Aneurysm Stable on recent CT scan. -Continue current management and routine monitoring.  General Health Maintenance -Continue regular exercise regimen with dog walking and potential cycling. -Refill Ramipril as needed.    Disposition: Follow up 12 months with Charlton Haws, MD or  APP.  Signed, Sharlene Dory, PA-C 02/12/2023, 3:53 PM [facility]

## 2023-02-13 ENCOUNTER — Encounter: Payer: Self-pay | Admitting: Physician Assistant

## 2023-02-13 NOTE — Telephone Encounter (Signed)
I thought your EKG looked just fine, its just a slower rhythm like we discussed.  Thanks  Sharlene Dory, PA-C

## 2023-04-15 ENCOUNTER — Telehealth: Payer: Self-pay | Admitting: Psychiatry

## 2023-04-15 NOTE — Telephone Encounter (Signed)
 LF 10/20 for 90 days, due 1/16.

## 2023-04-15 NOTE — Telephone Encounter (Signed)
 Pt called and made an appt in march. He needs a refill  on his lorazapam 1 mg. He is out of town. So please send script to the [facility] located at [address] [place] [address], [address]

## 2023-04-18 ENCOUNTER — Other Ambulatory Visit: Payer: Self-pay

## 2023-04-18 DIAGNOSIS — F411 Generalized anxiety disorder: Secondary | ICD-10-CM

## 2023-04-18 MED ORDER — LORAZEPAM 1 MG PO TABS: ORAL_TABLET | ORAL | 0 refills | Status: DC

## 2023-04-18 NOTE — Telephone Encounter (Signed)
Pended lorazepam to [facility] in [place].

## 2023-05-30 ENCOUNTER — Other Ambulatory Visit: Payer: Self-pay | Admitting: Psychiatry

## 2023-05-30 DIAGNOSIS — F411 Generalized anxiety disorder: Secondary | ICD-10-CM

## 2023-06-27 ENCOUNTER — Encounter: Payer: Self-pay | Admitting: Psychiatry

## 2023-06-27 ENCOUNTER — Ambulatory Visit: Payer: Medicare HMO | Admitting: Psychiatry

## 2023-06-27 DIAGNOSIS — F411 Generalized anxiety disorder: Secondary | ICD-10-CM | POA: Diagnosis not present

## 2023-06-27 MED ORDER — LORAZEPAM 1 MG PO TABS: ORAL_TABLET | ORAL | 1 refills | Status: DC

## 2023-06-27 MED ORDER — ESCITALOPRAM OXALATE 10 MG PO TABS: 10.0000 mg | ORAL_TABLET | Freq: Every evening | ORAL | 3 refills | Status: DC

## 2023-06-27 NOTE — Progress Notes (Signed)
 Howard Garrison [id number] 17-Sep-1955 68 y.o.   Subjective:   Patient ID:  Howard Garrison is a 68 y.o. (DOB 1955-09-09) male.  Chief Complaint:  Chief Complaint  Patient presents with   Follow-up   Anxiety    HPI Howard Garrison presents for follow-up of anxiety.  seen September 2019.  He had reduced Lexapro from 5 mg nightly to about 2.5 mg a day.  He was given the option to increase it back to 5 mg if needed.  He also continued on lorazepam 1 mg 3 times daily and was usually taking about 2 daily.  04/13/2019 appt with noted: He's taking 5 mg BID Lexapro for 2 weeks and it's helped some.  Less rumination on the situation with his D.  the same amount of lorazepam.   Rough year with D taking GD away from them.  Been very stressed.  Howard Garrison 68 yo. Plan: No med changes  03/29/2021 appointment with the following noted: Continues Lexapro 10 mg daily and lorazepam 1 mg 3 times daily Wife Howard Garrison Had Covid 3 mos ago leading to pneumonia.  Kicked off MS.  He's been caregiving. Son didn't respond at [facility].  D Howard Garrison still has kept herself estranged. He had mild Covid.  06/27/23 appt noted:  Continues Lexapro 10 mg daily and lorazepam 1 mg BID and 2 mg HS & gabapentin 100 TID for HA Had HA all life and saw neuro and RX gabapentin which worked. No SE unless worsening ED.   BZ seemed timed out just right.  Wakes with some anxious but low and then resolves.  Sleep better with lorazepam.   Patient reports stable mood and denies depressed or irritable moods.  Not sleeping as well thinking about these things. Denies appetite disturbance.  Patient reports that energy and motivation have been good.  Patient denies any difficulty with concentration.  Patient denies any suicidal ideation.   Past Psychiatric Medication Trials: Lexapro 10, Ativan 1 mg TID  Review of Systems:  Review of Systems  Cardiovascular:  Negative for chest pain and palpitations.  Neurological:  Positive for tremors.  Negative for weakness.  Psychiatric/Behavioral:  Negative for agitation. The patient is not nervous/anxious.     Medications: I have reviewed the patient's current medications.  Current Outpatient Medications  Medication Sig Dispense Refill   amLODipine (NORVASC) 5 MG tablet Take 1 tablet (5 mg total) by mouth daily. 90 tablet 3   atorvastatin (LIPITOR) 10 MG tablet Take 1 tablet (10 mg total) by mouth daily. 90 tablet 3   gabapentin (NEURONTIN) 100 MG capsule Take 100 mg by mouth 3 (three) times daily.     meloxicam (MOBIC) 15 MG tablet Take 15 mg by mouth daily.     ramipril (ALTACE) 10 MG capsule Take 1 capsule (10 mg total) by mouth daily. 90 capsule 3   Vitamin D, Cholecalciferol, 25 MCG (1000 UT) CAPS Take 1 capsule by mouth daily.     escitalopram (LEXAPRO) 10 MG tablet Take 1 tablet (10 mg total) by mouth every evening. 90 tablet 3   LORazepam (ATIVAN) 1 MG tablet TAKE 1 TABLET BY MOUTH EVERY MORNING AND AT 2:00PM AND 2 TABLETS AT NIGHT 360 tablet 1   No current facility-administered medications for this visit.    Medication Side Effects: None  Allergies:  Allergies  Allergen Reactions   Metoprolol Other (See Comments)    Metoprolol (lopressor)    Past Medical History:  Diagnosis Date   Aortic insufficiency 09/2008  Mild,/ Moderate  echo, July, 2010   Beta-blocker intolerance    July, 2010, metoprolol   Bicuspid aortic valve 09/2008   Bicuspid aortic valve echo, July, 2010  //  cardiac CT angiogram October, 2011, bicuspid aortic valve   CAD (coronary artery disease)    Calcium score of 11, single nidus in the proximal LAD, cardiac CT angiogram  October, 2011   Chest pain 09/2008    stress echo.  WJXB,1478.Marland Kitchennormal...EF % 50-55%   Dilated aortic root (HCC) 09/2008   echo...44-41mm/july, 2011...echo..45-16mm   Ejection fraction    EF 50-55%, echo, July, 2010   HTN (hypertension)    borderline   Low HDL (under 40)    LDL 110 05/9560   Lung nodule    Triangular-shaped  nodule along the minor fissure stable and likely subpleural lymph node, CT scan, October 16, 2010 no further CT scans needed by report   Nephrolithiasis    Palpitations 09/2008   Holter, one short burst SVT  2 am   Prolonged Q-T interval on ECG 2010   .Marland KitchenMarland KitchenJuly, 2011   PVC (premature ventricular contraction)    S/P cholecystectomy    Viral illness 07/2008   prolonged    Family History  Problem Relation Age of Onset   Diabetes Mother    Prostate cancer Father     Social History   Socioeconomic History   Marital status: Married    Spouse name: Not on file   Number of children: Not on file   Years of education: Not on file   Highest education level: Not on file  Occupational History   Not on file  Tobacco Use   Smoking status: Never   Smokeless tobacco: Never  Substance and Sexual Activity   Alcohol use: Yes    Comment: very limited   Drug use: No   Sexual activity: Not on file  Other Topics Concern   Not on file  Social History Narrative   Not on file   Social Drivers of Health   Financial Resource Strain: Low Risk  (01/22/2022)   Received from [facility]   Overall Financial Resource Strain (CARDIA)    Difficulty of Paying Living Expenses: Not hard at all  Food Insecurity: No Food Insecurity (01/22/2022)   Received from [facility]   Hunger Vital Sign    Worried About Running Out of Food in the Last Year: Never true    Ran Out of Food in the Last Year: Never true  Transportation Needs: No Transportation Needs (12/02/2019)   Received from [facility]   PRAPARE - Transportation    Lack of Transportation (Medical): No    Lack of Transportation (Non-Medical): No  Physical Activity: Sufficiently Active (01/22/2022)   Received from [facility]   Exercise Vital Sign    Days of Exercise per Week: 5 days    Minutes of Exercise per Session: 40 min  Stress: Stress Concern Present (01/22/2022)   Received  from [facility] - Occupational Stress Questionnaire    Feeling of Stress : To some extent  Social Connections: Unknown (01/23/2023)   Received from [facility]   Social Network    Social Network: Not on file  Intimate Partner Violence: Unknown (01/23/2023)   Received from [facility]   HITS    Physically Hurt: Not on file    Insult or Talk Down To: Not on file  Threaten Physical Harm: Not on file    Scream or Curse: Not on file    Past Medical History, Surgical history, Social history, and Family history were reviewed and updated as appropriate.   Please see review of systems for further details on the patient's review from today.   Objective:   Physical Exam:  There were no vitals taken for this visit.  Physical Exam Neurological:     Mental Status: He is alert and oriented to person, place, and time.     Cranial Nerves: No dysarthria.  Psychiatric:        Attention and Perception: Attention and perception normal.        Mood and Affect: Mood is anxious. Mood is not depressed.        Speech: Speech normal.        Behavior: Behavior is cooperative.        Thought Content: Thought content normal. Thought content is not delusional. Thought content does not include homicidal or suicidal ideation.        Cognition and Memory: Cognition and memory normal.        Judgment: Judgment normal.     Comments: Insight intact     Lab Review:     Component Value Date/Time   NA 141 07/05/2020 0854   K 4.4 07/05/2020 0854   CL 107 (H) 07/05/2020 0854   CO2 21 07/05/2020 0854   GLUCOSE 121 (H) 07/05/2020 0854   BUN 31 (H) 07/05/2020 0854   CREATININE 0.97 07/05/2020 0854   CALCIUM 9.0 07/05/2020 0854   GFRNONAA 64 08/26/2018 1423   GFRAA 74 08/26/2018 1423    No results found for: "WBC", "RBC", "HGB", "HCT", "PLT", "MCV", "MCH", "MCHC", "RDW", "LYMPHSABS", "MONOABS", "EOSABS", "BASOSABS"  No results found for:  "POCLITH", "LITHIUM"   No results found for: "PHENYTOIN", "PHENOBARB", "VALPROATE", "CBMZ"   .res Assessment: Plan:    R[place] was seen today for follow-up and anxiety.  Diagnoses and all orders for this visit:  Generalized anxiety disorder -     escitalopram (LEXAPRO) 10 MG tablet; Take 1 tablet (10 mg total) by mouth every evening. -     LORazepam (ATIVAN) 1 MG tablet; TAKE 1 TABLET BY MOUTH EVERY MORNING AND AT 2:00PM AND 2 TABLETS AT NIGHT   Doing well without complaints.  No change indicated.  We discussed the short-term risks associated with benzodiazepines including sedation and increased fall risk among others.  Discussed long-term side effect risk including dependence, potential withdrawal symptoms, and the potential eventual dose-related risk of dementia.  But recent studies from 2020 dispute this association between benzodiazepines and dementia risk. Newer studies in 2020 do not support an association with dementia. Disc issues with FDA approval and lack of long terms use but he benefits. Option reduce BZ   Doing well with meds. QTC 454 05/2019 and FU Feb  He does not want any change: continue Lexapro 10 mg daily. OK continue Ativan 1 mg TID He's satisfied.  FU 1 year  Meredith Staggers, MD, DFAPA   Please see After Visit Summary for patient specific instructions.  No future appointments.   No orders of the defined types were placed in this encounter.     -------------------------------

## 2023-08-28 ENCOUNTER — Other Ambulatory Visit: Payer: Self-pay | Admitting: Psychiatry

## 2023-08-28 DIAGNOSIS — F411 Generalized anxiety disorder: Secondary | ICD-10-CM

## 2023-09-04 ENCOUNTER — Telehealth: Payer: Self-pay | Admitting: Psychiatry

## 2023-09-04 NOTE — Telephone Encounter (Signed)
 Pt called and said that he spoke with his PCP about prescribing the trazodone 50 mg and the PCP said no that he would feel more comfortable if dr. Toi Foster prescribe the sleeping medicine of trazadone

## 2023-09-05 ENCOUNTER — Other Ambulatory Visit: Payer: Self-pay | Admitting: Psychiatry

## 2023-09-05 MED ORDER — TRAZODONE HCL 50 MG PO TABS: 50.0000 mg | ORAL_TABLET | Freq: Every evening | ORAL | 0 refills | Status: DC | PRN

## 2023-09-05 NOTE — Progress Notes (Signed)
 PCP requested we send in RX trazodone for sleep.  Sent.

## 2023-10-14 ENCOUNTER — Telehealth: Payer: Self-pay | Admitting: Psychiatry

## 2023-10-14 NOTE — Telephone Encounter (Signed)
 Due 7/15

## 2023-10-14 NOTE — Telephone Encounter (Signed)
 Pt called requesting Rx for Lorazepam  to [facility] [address] [place] [address] [phone]. Pt traveling and now [place]   [address] Pt contact [phone]

## 2023-10-15 ENCOUNTER — Other Ambulatory Visit: Payer: Self-pay

## 2023-10-15 DIAGNOSIS — F411 Generalized anxiety disorder: Secondary | ICD-10-CM

## 2023-10-15 MED ORDER — LORAZEPAM 1 MG PO TABS: ORAL_TABLET | ORAL | 0 refills | Status: DC

## 2023-10-15 NOTE — Telephone Encounter (Signed)
 Pended.

## 2023-11-11 ENCOUNTER — Other Ambulatory Visit: Payer: Self-pay | Admitting: Psychiatry

## 2023-11-11 ENCOUNTER — Other Ambulatory Visit: Payer: Self-pay

## 2023-11-13 MED ORDER — TRAZODONE HCL 50 MG PO TABS: 50.0000 mg | ORAL_TABLET | Freq: Every evening | ORAL | 0 refills | Status: DC | PRN

## 2023-11-14 ENCOUNTER — Other Ambulatory Visit: Payer: Self-pay | Admitting: Behavioral Health

## 2023-11-14 DIAGNOSIS — F411 Generalized anxiety disorder: Secondary | ICD-10-CM

## 2023-11-14 NOTE — Telephone Encounter (Signed)
 Pt LVM @ 12:46p that he called earlier this week about refill of Lorazepam  to   [id number] - [place], [place] - [address], [place] [address] Phone: [phone]  Fax: [phone]   He said he is requesting a 90 day script.  Next appt 3/30

## 2023-12-16 ENCOUNTER — Other Ambulatory Visit: Payer: Self-pay

## 2023-12-16 ENCOUNTER — Telehealth: Payer: Self-pay | Admitting: Psychiatry

## 2023-12-16 DIAGNOSIS — F411 Generalized anxiety disorder: Secondary | ICD-10-CM

## 2023-12-16 MED ORDER — LORAZEPAM 1 MG PO TABS: ORAL_TABLET | ORAL | 1 refills | Status: DC

## 2023-12-16 NOTE — Telephone Encounter (Signed)
 Pt is traveling and didn't get his 90 day supply when he req a rf last. The pharm only gave him 30 days. So he needs 60 day supply of Lorazepam .    [facility] [address]  [place] [address]  [phone]

## 2023-12-16 NOTE — Telephone Encounter (Signed)
 Previous script sent for 90 days, but pharmacy only filled 30 days. Pended script to [facility] pharmacy for 60 days per request.

## 2024-01-03 LAB — COLOGUARD

## 2024-01-15 ENCOUNTER — Telehealth: Payer: Self-pay | Admitting: Psychiatry

## 2024-01-15 NOTE — Telephone Encounter (Signed)
 LF 9/18 per [facility] pharmacy. Will fill 10/16.

## 2024-01-15 NOTE — Telephone Encounter (Signed)
 Pt called back in [place] from traveling for a short time. Requesting RF Lorazepam  at new pharmacy [facility] [address] [place]. Last Rx was 90 day. [place] would only fill for 30 day. Will be out in few days. Contact pt [phone]

## 2024-01-16 ENCOUNTER — Other Ambulatory Visit: Payer: Self-pay

## 2024-01-16 DIAGNOSIS — F411 Generalized anxiety disorder: Secondary | ICD-10-CM

## 2024-01-16 MED ORDER — LORAZEPAM 1 MG PO TABS: ORAL_TABLET | ORAL | 1 refills | Status: DC

## 2024-01-16 NOTE — Telephone Encounter (Signed)
 Pended

## 2024-01-20 ENCOUNTER — Telehealth: Payer: Self-pay | Admitting: Psychiatry

## 2024-01-20 NOTE — Telephone Encounter (Signed)
 Called pharmacy. Is ready for pickup. Pt notified.

## 2024-01-20 NOTE — Telephone Encounter (Signed)
 Pt tried to get rf of Ativan  but pharm is telling him its too early. He said he picked up a script for 30 days in [place] while they were traveling.

## 2024-02-07 NOTE — Progress Notes (Signed)
 Office Visit    Patient Name: Howard Garrison Date of Encounter: 02/17/2024  PCP:  [facility]  Cardiologist:  Maude Emmer, MD  Advanced Practice Provider:  No care team member to display Electrophysiologist:  None   HPI    CHADDRICK BRUE is a 68 y.o. male with past medical history significant for bicuspid AV with aortic insufficiency, mild dilatation of the aortic root, palpitations, and hypertension presents today for 21-month follow-up appointment.  Most recent echo done 01/10/23 EF 60-65% moderate LVE moderate AR with mild AS bicuspid valve mean gradient 13 peak 23 mmHg   His cardiac CTA done 08/22/21  showed nonobstructive CAD, bicuspid AV severs 1 raphe between left and right cusps, AVA 2.2 cm.  Calcium  score 1886.  Ascending thoracic aortic aneurysm measuring 4.3 cm with dilated sinus 4.6 cm.  No evidence of coarctation or SBE/vegetations.  Overall, reassuring scan.  01/2022 started gabapentin for headaches .  Walks his German Shepherd puppy regularly.BP good at home On altace  and norvasc   No longer races bicycles   Reports no shortness of breath nor dyspnea on exertion. Reports no chest pain, pressure, or tightness. No edema, orthopnea, PND. Reports no palpitations.   He is now using motor home and traveling the country. He is in [place] for a few weeks in spring and fall only    Past Medical History    Past Medical History:  Diagnosis Date   Aortic insufficiency 09/2008   Mild,/ Moderate  echo, July, 2010   Beta-blocker intolerance    July, 2010, metoprolol   Bicuspid aortic valve 09/2008   Bicuspid aortic valve echo, July, 2010  //  cardiac CT angiogram October, 2011, bicuspid aortic valve   CAD (coronary artery disease)    Calcium  score of 11, single nidus in the proximal LAD, cardiac CT angiogram  October, 2011   Chest pain 09/2008    stress echo.  Glob,7989.SABRAnormal...EF % 50-55%   Dilated aortic root 09/2008    echo...44-74mm/july, 2011...echo..45-76mm   Ejection fraction    EF 50-55%, echo, July, 2010   HTN (hypertension)    borderline   Low HDL (under 40)    LDL 110 05/7987   Lung nodule    Triangular-shaped nodule along the minor fissure stable and likely subpleural lymph node, CT scan, October 16, 2010 no further CT scans needed by report   Nephrolithiasis    Palpitations 09/2008   Holter, one short burst SVT  2 am   Prolonged Q-T interval on ECG 2010   .SABRASABRAJuly, 2011   PVC (premature ventricular contraction)    S/P cholecystectomy    Viral illness 07/2008   prolonged   Past Surgical History:  Procedure Laterality Date   CHOLECYSTECTOMY     KNEE ARTHROSCOPY     x 4    Allergies  Allergies  Allergen Reactions   Metoprolol Other (See Comments)    Metoprolol (lopressor)    EKGs/Labs/Other Studies Reviewed:   The following studies were reviewed today:  Coronary CTA 08/22/2021  Narrative & Impression  CLINICAL DATA:  Bicuspid AV/ Aortic aneurysm   EXAM: Cardiac CTA   MEDICATIONS: Sub lingual nitro.  4mg    TECHNIQUE: The patient was scanned on a Siemens Force 192 slice scanner. Gantry rotation speed was 250 msecs. Collimation was .6 mm. A 100 kV retrospective scan was triggered in the ascending thoracic aorta at 140 HU's. Average HR during the scan was 50 bpm. The 3D  data set was interpreted on a dedicated work station using MPR, MIP and VRT modes. A total of 80 cc of contrast was used.   FINDINGS: Non-cardiac: See separate report from [facility]. No significant findings on limited lung and soft tissue windows.   Calcium  Score: Calcium  noted in the LAD/RCA   LM: 0   LAD 77.6   RCA 22.5   LCX 0   Total: 100 which is 48 th percentile for age/sex   Coronary Arteries: Right dominant with no anomalies   LM: Normal   LAD: 1-24% calcified plaque in ostial LAD 1-24% mixed plaque in proximal LAD   D1: Normal   D2: Normal   Circumflex: Normal    OM1: Normal   OM2: Normal   RCA: 1-24% calcified plaque in proximal vessel   PDA: Normal   PLA: Normal   Aortic Valve: Calcium  score 1886 Bicuspid Sievers 1 with raphe between left and right cusps calcified with calcification of leaflet edges as well AVA by planimetry 2.2 cm2   Dilated ascending thoracic aorta 4.3 cm in orthogonal planes No vegetations or SBE noted Raphe to non coronary sinus 4.6 cm Commissural sinus 3.7 cm Bovine Arch No coarctation No significant aortic atherosclerosis   IMPRESSION: 1. CAD RADS 1 non obstructive CAD see description above   2. Bicuspid AV Sievers 1 Raphe between left and right cusps AVA planimetry 2.2 cm2 Calcium  score 1886   3. Ascending thoracic aortic aneurysm measuring 4.3 cm with dilated sinus 4.6 cm   4.  No evidence of coarctation or SBE/vegetations   Maude Emmer   Electronically Signed: By: Maude Emmer M.D. On: 08/22/2021 15:29     Echocardiogram  01/10/23   1. Left ventricular ejection fraction, by estimation, is 60 to 65%. The  left ventricle has normal function. The left ventricle has no regional  wall motion abnormalities. The left ventricular internal cavity size was  moderately dilated. Left ventricular  diastolic parameters are consistent with Grade I diastolic dysfunction  (impaired relaxation).   2. Right ventricular systolic function is normal. The right ventricular  size is normal.   3. The mitral valve is normal in structure. No evidence of mitral valve  regurgitation. No evidence of mitral stenosis.   4. The aortic valve is bicuspid. There is moderate calcification of the  aortic valve. There is moderate thickening of the aortic valve. Aortic  valve regurgitation is moderate. Mild aortic valve stenosis. Aortic valve  area, by VTI measures 2.04 cm.  Aortic valve mean gradient measures 13.2 mmHg. Aortic valve Vmax measures  2.41 m/s.   5. Aortic dilatation noted. There is moderate dilatation of the  aortic  root, measuring 46 mm. There is mild dilatation of the ascending aorta,  measuring 41 mm.   6. The inferior vena cava is normal in size with greater than 50%  respiratory variability, suggesting right atrial pressure of 3 mmHg.   Comparison(s): Prior images reviewed side by side. Prior aortic root 47 mm  2023 - stable.   AORTIC VALVE  AV Area (Vmax):    1.92 cm  AV Area (Vmean):   1.84 cm  AV Area (VTI):     2.04 cm  AV Vmax:           240.60 cm/s  AV Vmean:          171.000 cm/s  AV VTI:            0.587 m  AV Peak Grad:  23.2 mmHg  AV Mean Grad:      13.2 mmHg  LVOT Vmax:         65.30 cm/s  LVOT Vmean:        44.400 cm/s  LVOT VTI:          0.170 m  LVOT/AV VTI ratio: 0.29  AI PHT:            335 msec   EKG:  SR rate 49 PR 234 msec 02/12/23  Recent Labs: No results found for requested labs within last 365 days.  Recent Lipid Panel    Component Value Date/Time   CHOL 163 10/25/2008 0946   TRIG 97.0 10/25/2008 0946   HDL 32.60 (L) 10/25/2008 0946   CHOLHDL 5 10/25/2008 0946   VLDL 19.4 10/25/2008 0946   LDLCALC 111 (H) 10/25/2008 0946    Home Medications   Current Meds  Medication Sig   amLODipine  (NORVASC ) 5 MG tablet Take 1 tablet (5 mg total) by mouth daily.   atorvastatin  (LIPITOR) 10 MG tablet Take 1 tablet (10 mg total) by mouth daily.   escitalopram  (LEXAPRO ) 10 MG tablet TAKE 1 TABLET BY MOUTH EVERY EVENING   gabapentin (NEURONTIN) 100 MG capsule Take 100 mg by mouth 3 (three) times daily.   LORazepam  (ATIVAN ) 1 MG tablet TAKE ONE TABLET BY MOUTH EVERY DAY IN THE MORNING AND AT 2:00PM AND 2 TABLETS AT NIGHT MAXIMUM DAILY DOSE =4   meloxicam (MOBIC) 15 MG tablet Take 15 mg by mouth daily.   ramipril  (ALTACE ) 10 MG capsule Take 1 capsule (10 mg total) by mouth daily.   traZODone  (DESYREL ) 50 MG tablet Take 1-2 tablets (50-100 mg total) by mouth at bedtime as needed for sleep.   Vitamin D, Cholecalciferol, 25 MCG (1000 UT) CAPS Take 1 capsule by  mouth daily.     Review of Systems      All other systems reviewed and are otherwise negative except as noted above.  Physical Exam    VS:  BP (!) 146/78 (BP Location: Left Arm, Patient Position: Sitting, Cuff Size: Large)   Pulse (!) 54   Ht 6' 1 (1.854 m)   Wt 205 lb 9.6 oz (93.3 kg)   SpO2 95%   BMI 27.13 kg/m  , BMI Body mass index is 27.13 kg/m.  Wt Readings from Last 3 Encounters:  02/14/24 205 lb 9.6 oz (93.3 kg)  02/12/23 214 lb 3.2 oz (97.2 kg)  02/16/22 216 lb (98 kg)     Affect appropriate Healthy:  appears stated age HEENT: normal Neck supple with no adenopathy JVP normal no bruits no thyromegaly Lungs clear with no wheezing and good diaphragmatic motion Heart:  S1/S2 AR murmur and SEM through bicuspid AV Abdomen: benighn, BS positve, no tenderness, no AAA no bruit.  No HSM or HJR Distal pulses intact with no bruits No edema Neuro non-focal Skin warm and dry No muscular weakness   Assessment & Plan    Bicuspid Aortic Valve with Moderate Aortic Regurgitation and Mild Aortic Stenosis Stable on recent echocardiogram.01/10/23  No symptoms of fatigue, lightheadedness, or dizziness. -Continue current management and follow-up with annual echocardiogram. -Ascending aortic aneurysm 4.2cm - No beta blocker due to resting bradycardia and first degree AV block - BP controlled with norvasc  and altace   Hypertension -continue altace  and norvasc    Thoracic Aortic Aneurysm Stable on recent CT scan.08/22/21 4.2 cm  -Continue current management and routine monitoring. - BP control ACE/calcium  blocker - Avoid quinolone antibiotics  Gated chest CTA May 2026 Echo October 2026   Disposition: Follow up 12 months   Signed, Maude Emmer, MD 02/17/2024, 8:29 AM [facility]

## 2024-02-14 ENCOUNTER — Ambulatory Visit: Attending: Cardiovascular Disease | Admitting: Cardiovascular Disease

## 2024-02-14 VITALS — BP 146/78 | HR 54 | Ht 73.0 in | Wt 205.6 lb

## 2024-02-14 DIAGNOSIS — I10 Essential (primary) hypertension: Secondary | ICD-10-CM | POA: Diagnosis not present

## 2024-02-14 DIAGNOSIS — I712 Thoracic aortic aneurysm, without rupture, unspecified: Secondary | ICD-10-CM

## 2024-02-14 DIAGNOSIS — Q2381 Bicuspid aortic valve: Secondary | ICD-10-CM | POA: Diagnosis not present

## 2024-02-14 NOTE — Patient Instructions (Signed)
 Medication Instructions:  Your physician recommends that you continue on your current medications as directed. Please refer to the Current Medication list given to you today.  *If you need a refill on your cardiac medications before your next appointment, please call your pharmacy*  Lab Work: BMET-prior to your CT that is due in May 2026-you can go to any [facility] to have this drawn If you have labs (blood work) drawn today and your tests are completely normal, you will receive your results only by: MyChart Message (if you have MyChart) OR A paper copy in the mail If you have any lab test that is abnormal or we need to change your treatment, we will call you to review the results.  Testing/Procedures: Your physician has requested that you have an echocardiogram in October 2026. Echocardiography is a painless test that uses sound waves to create images of your heart. It provides your doctor with information about the size and shape of your heart and how well your heart's chambers and valves are working. This procedure takes approximately one hour. There are no restrictions for this procedure. Please do NOT wear cologne, perfume, aftershave, or lotions (deodorant is allowed). Please arrive 15 minutes prior to your appointment time.  Please note: We ask at that you not bring children with you during ultrasound (echo/ vascular) testing. Due to room size and safety concerns, children are not allowed in the ultrasound rooms during exams. Our front office staff cannot provide observation of children in our lobby area while testing is being conducted. An adult accompanying a patient to their appointment will only be allowed in the ultrasound room at the discretion of the ultrasound technician under special circumstances. We apologize for any inconvenience.   Your provider has recommended that you have a gated CTA in May of 2026  Follow-Up: At [facility], you and your health needs are our  priority.  As part of our continuing mission to provide you with exceptional heart care, our providers are all part of one team.  This team includes your primary Cardiologist (physician) and Advanced Practice Providers or APPs (Physician Assistants and Nurse Practitioners) who all work together to provide you with the care you need, when you need it.  Your next appointment:   October 2026  Provider:   Maude Emmer, MD

## 2024-02-18 ENCOUNTER — Ambulatory Visit: Admitting: Psychiatry

## 2024-02-22 ENCOUNTER — Other Ambulatory Visit: Payer: Self-pay | Admitting: Psychiatry

## 2024-03-02 ENCOUNTER — Telehealth: Payer: Self-pay | Admitting: Psychiatry

## 2024-03-02 ENCOUNTER — Ambulatory Visit: Admitting: Psychiatry

## 2024-03-02 ENCOUNTER — Encounter: Payer: Self-pay | Admitting: Psychiatry

## 2024-03-02 DIAGNOSIS — F411 Generalized anxiety disorder: Secondary | ICD-10-CM | POA: Diagnosis not present

## 2024-03-02 DIAGNOSIS — F5105 Insomnia due to other mental disorder: Secondary | ICD-10-CM

## 2024-03-02 MED ORDER — ESCITALOPRAM OXALATE 5 MG PO TABS: 5.0000 mg | ORAL_TABLET | Freq: Every day | ORAL | 0 refills | Status: DC

## 2024-03-02 MED ORDER — LORAZEPAM 1 MG PO TABS: ORAL_TABLET | ORAL | 1 refills | Status: DC

## 2024-03-02 MED ORDER — TRAZODONE HCL 50 MG PO TABS: 50.0000 mg | ORAL_TABLET | Freq: Every evening | ORAL | 3 refills | Status: DC | PRN

## 2024-03-02 NOTE — Progress Notes (Signed)
 Howard Garrison [id number] [birth year] 68 y.o.   Subjective:   Patient ID:  Howard Garrison is a 68 y.o. (DOB 1955/11/28) male.  Chief Complaint:  Chief Complaint  Patient presents with   Follow-up   Anxiety    HPI Howard Garrison presents for follow-up of anxiety.  seen September 2019.  He had reduced Lexapro  from 5 mg nightly to about 2.5 mg a day.  He was given the option to increase it back to 5 mg if needed.  He also continued on lorazepam  1 mg 3 times daily and was usually taking about 2 daily.  04/13/2019 appt with noted: He's taking 5 mg BID Lexapro  for 2 weeks and it's helped some.  Less rumination on the situation with his D.  the same amount of lorazepam .   Rough year with D taking GD away from them.  Been very stressed.  Howard Garrison 68 yo. Plan: No med changes  03/29/2021 appointment with the following noted: Continues Lexapro  10 mg daily and lorazepam  1 mg 3 times daily Wife Vina Had Covid 3 mos ago leading to pneumonia.  Kicked off [place].  He's been caregiving. Son didn't respond at [facility].  D Lauraine still has kept herself estranged. He had mild Covid.  06/27/23 appt noted:  Continues Lexapro  10 mg daily and lorazepam  1 mg BID and 2 mg HS & gabapentin 100 TID for HA Had HA all life and saw neuro and RX gabapentin which worked. No SE unless worsening ED.   BZ seemed timed out just right.  Wakes with some anxious but low and then resolves.  Sleep better with lorazepam .   Patient reports stable mood and denies depressed or irritable moods.  Not sleeping as well thinking about these things. Denies appetite disturbance.  Patient reports that energy and motivation have been good.  Patient denies any difficulty with concentration.  Patient denies any suicidal ideation.  03/02/24 appt noted:  Continues Lexapro  10 mg daily, lorazepam  1 mg BID and 2 mg HS, & gabapentin 100 TID for HA, trazodone  50-100 mg HS No SE Ongoing sleep problems. Vina says too much Diet Dr. Nunzio.  And he stopped about 5 pm and that helped remarkably with no trouble going to sleep.  But working long hours lately.   [place]. Gabapentin helps HA Currently trazodone  helping sleep if no caffeiine and generally sleeps through the night and getting back to sleep.    Past Psychiatric Medication Trials: Lexapro  10, Ativan  1 mg TID  Review of Systems:  Review of Systems  Cardiovascular:  Negative for chest pain and palpitations.  Neurological:  Positive for tremors. Negative for weakness.  Psychiatric/Behavioral:  Negative for agitation. The patient is not nervous/anxious.     Medications: I have reviewed the patient's current medications.  Current Outpatient Medications  Medication Sig Dispense Refill   amLODipine  (NORVASC ) 5 MG tablet Take 1 tablet (5 mg total) by mouth daily. 90 tablet 3   atorvastatin  (LIPITOR) 10 MG tablet Take 1 tablet (10 mg total) by mouth daily. 90 tablet 3   gabapentin (NEURONTIN) 100 MG capsule Take 100 mg by mouth 3 (three) times daily.     meloxicam (MOBIC) 15 MG tablet Take 15 mg by mouth daily.     ramipril  (ALTACE ) 10 MG capsule Take 1 capsule (10 mg total) by mouth daily. 90 capsule 3   Vitamin D, Cholecalciferol, 25 MCG (1000 UT) CAPS Take 1 capsule by mouth daily.  escitalopram  (LEXAPRO ) 5 MG tablet Take 1 tablet (5 mg total) by mouth daily. 60 tablet 0   LORazepam  (ATIVAN ) 1 MG tablet TAKE ONE TABLET BY MOUTH EVERY DAY IN THE MORNING AND AT 2:00PM AND 2 TABLETS AT NIGHT MAXIMUM DAILY DOSE =4 360 tablet 1   traZODone  (DESYREL ) 50 MG tablet Take 1-2 tablets (50-100 mg total) by mouth at bedtime as needed. for sleep 180 tablet 3   No current facility-administered medications for this visit.    Medication Side Effects: None  Allergies:  Allergies  Allergen Reactions   Metoprolol Other (See Comments)    Metoprolol (lopressor)    Past Medical History:  Diagnosis Date   Aortic insufficiency 09/2008   Mild,/ Moderate   echo, July, 2010   Beta-blocker intolerance    July, 2010, metoprolol   Bicuspid aortic valve 09/2008   Bicuspid aortic valve echo, July, 2010  //  cardiac CT angiogram October, 2011, bicuspid aortic valve   CAD (coronary artery disease)    Calcium  score of 11, single nidus in the proximal LAD, cardiac CT angiogram  October, 2011   Chest pain 09/2008    stress echo.  Glob,7989.SABRAnormal...EF % 50-55%   Dilated aortic root 09/2008   echo...44-77mm/july, 2011...echo..45-91mm   Ejection fraction    EF 50-55%, echo, July, 2010   HTN (hypertension)    borderline   Low HDL (under 40)    LDL 110 05/7987   Lung nodule    Triangular-shaped nodule along the minor fissure stable and likely subpleural lymph node, CT scan, October 16, 2010 no further CT scans needed by report   Nephrolithiasis    Palpitations 09/2008   Holter, one short burst SVT  2 am   Prolonged Q-T interval on ECG 2010   .SABRASABRAJuly, 2011   PVC (premature ventricular contraction)    S/P cholecystectomy    Viral illness 07/2008   prolonged    Family History  Problem Relation Age of Onset   Diabetes Mother    Prostate cancer Father     Social History   Socioeconomic History   Marital status: Married    Spouse name: Not on file   Number of children: Not on file   Years of education: Not on file   Highest education level: Not on file  Occupational History   Not on file  Tobacco Use   Smoking status: Never   Smokeless tobacco: Never  Substance and Sexual Activity   Alcohol use: Yes    Comment: very limited   Drug use: No   Sexual activity: Not on file  Other Topics Concern   Not on file  Social History Narrative   Not on file   Social Drivers of Health   Financial Resource Strain: Low Risk  (01/22/2022)   [facility]   Overall Financial Resource Strain (CARDIA)    Difficulty of Paying Living Expenses: Not hard at all  Food Insecurity: No Food Insecurity (01/22/2022)   Received from [facility]   Hunger Vital Sign    Within the past 12 months, you worried that your food would run out before you got the money to buy more.: Never true    Within the past 12 months, the food you bought just didn't last and you didn't have money to get more.: Never true  Transportation Needs: No Transportation Needs (12/02/2019)   Received from [facility] - Transportation    Lack of Transportation (Medical): No  Lack of Transportation (Non-Medical): No  Physical Activity: Sufficiently Active (01/22/2022)   Received from [facility]   Exercise Vital Sign    On average, how many days per week do you engage in moderate to strenuous exercise (like a brisk walk)?: 5 days    On average, how many minutes do you engage in exercise at this level?: 40 min  Stress: Stress Concern Present (01/22/2022)   Received from [facility] - Occupational Stress Questionnaire    Feeling of Stress : To some extent  Social Connections: Unknown (01/23/2023)   Received from [facility]   Social Network    Social Network: Not on file  Intimate Partner Violence: Unknown (01/23/2023)   Received from [facility]   HITS    Physically Hurt: Not on file    Insult or Talk Down To: Not on file    Threaten Physical Harm: Not on file    Scream or Curse: Not on file    Past Medical History, Surgical history, Social history, and Family history were reviewed and updated as appropriate.   Please see review of systems for further details on the patient's review from today.   Objective:   Physical Exam:  There were no vitals taken for this visit.  Physical Exam Neurological:     Mental Status: He is alert and oriented to person, place, and time.     Cranial Nerves: No dysarthria.  Psychiatric:        Attention and Perception: Attention and perception normal.        Mood and Affect: Mood is anxious. Mood is not depressed.        Speech: Speech normal.         Behavior: Behavior is cooperative.        Thought Content: Thought content normal. Thought content is not delusional. Thought content does not include homicidal or suicidal ideation.        Cognition and Memory: Cognition and memory normal.        Judgment: Judgment normal.     Comments: Insight intact     Lab Review:     Component Value Date/Time   NA 141 07/05/2020 0854   K 4.4 07/05/2020 0854   CL 107 (H) 07/05/2020 0854   CO2 21 07/05/2020 0854   GLUCOSE 121 (H) 07/05/2020 0854   BUN 31 (H) 07/05/2020 0854   CREATININE 0.97 07/05/2020 0854   CALCIUM  9.0 07/05/2020 0854   GFRNONAA 64 08/26/2018 1423   GFRAA 74 08/26/2018 1423    No results found for: WBC, RBC, HGB, HCT, PLT, MCV, MCH, MCHC, RDW, LYMPHSABS, MONOABS, EOSABS, BASOSABS  No results found for: POCLITH, LITHIUM   No results found for: PHENYTOIN, PHENOBARB, VALPROATE, CBMZ   .res Assessment: Plan:    Howard Garrison was seen today for follow-up and anxiety.  Diagnoses and all orders for this visit:  Generalized anxiety disorder -     LORazepam  (ATIVAN ) 1 MG tablet; TAKE ONE TABLET BY MOUTH EVERY DAY IN THE MORNING AND AT 2:00PM AND 2 TABLETS AT NIGHT MAXIMUM DAILY DOSE =4 -     escitalopram  (LEXAPRO ) 5 MG tablet; Take 1 tablet (5 mg total) by mouth daily.  Insomnia due to mental condition -     traZODone  (DESYREL ) 50 MG tablet; Take 1-2 tablets (50-100 mg total) by mouth at bedtime as needed. for sleep   Doing well without complaints.    We discussed the short-term risks associated  with benzodiazepines including sedation and increased fall risk among others.  Discussed long-term side effect risk including dependence, potential withdrawal symptoms, and the potential eventual dose-related risk of dementia.  But recent studies from 2020 dispute this association between benzodiazepines and dementia risk. Newer studies in 2020 do not support an association with dementia. Disc issues  with FDA approval and lack of long terms use but he benefits. Option reduce BZ   Doing well with meds. QTC 454 05/2019 and FU Feb  Ok wean Lexapro  over 2 mos.  Doing well with less stress now. OK continue Ativan  1 mg TID Continue trazodone  helping sleep 50-100  FU 1 year  Lorene Macintosh, MD, DFAPA   Please see After Visit Summary for patient specific instructions.  Future Appointments  Date Time Provider Department Center  06/29/2024  2:00 PM Cottle, Lorene KANDICE Raddle., MD [facility] None     No orders of the defined types were placed in this encounter.     -------------------------------

## 2024-03-02 NOTE — Telephone Encounter (Signed)
 Pt called 11/28 to request a RF on Escitalopram . Noted that pt has appt today at 10AM.

## 2024-04-03 ENCOUNTER — Telehealth: Payer: Self-pay | Admitting: Cardiovascular Disease

## 2024-04-03 NOTE — Telephone Encounter (Signed)
" °*  STAT* If patient is at the pharmacy, call can be transferred to refill team.   1. Which medications need to be refilled? (please list name of each medication and dose if known)  atorvastatin  (LIPITOR) 10 MG tablet  ramipril  (ALTACE ) 10 MG capsule  amLODipine  (NORVASC ) 5 MG tablet   2. Would you like to learn more about the convenience, safety, & potential cost savings by using the [facility]?      3. Are you open to using the [facility] (Type [facility].  ).   4. Which pharmacy/location (including street and city if local pharmacy) is medication to be sent to? [facility], [address], [employer], [place]   5. Do they need a 30 day or 90 day supply? 90 day  "

## 2024-04-08 ENCOUNTER — Telehealth: Payer: Self-pay | Admitting: Psychiatry

## 2024-04-08 MED ORDER — AMLODIPINE BESYLATE 5 MG PO TABS: 5.0000 mg | ORAL_TABLET | Freq: Every day | ORAL | 3 refills | Status: AC

## 2024-04-08 MED ORDER — ATORVASTATIN CALCIUM 10 MG PO TABS: 10.0000 mg | ORAL_TABLET | Freq: Every day | ORAL | 3 refills | Status: AC

## 2024-04-08 MED ORDER — RAMIPRIL 10 MG PO CAPS: 10.0000 mg | ORAL_CAPSULE | Freq: Every day | ORAL | 3 refills | Status: AC

## 2024-04-08 NOTE — Telephone Encounter (Signed)
 Patient called in for refill on Lorazepam  1mg  and Trazedone 50mg . States that he is out of town and needs 90 day prescriptions sent to pharmacy. Ph: [phone] Appt 12/2 Pharmacy [facility] [phone] [address]  [place]

## 2024-04-08 NOTE — Telephone Encounter (Signed)
 Pt is not due for a RF of lorazepam  until 1/16.  I told him I could send in trazodone  now. He traveled extensively during the summer and every state he asked to send 90-day supply to would only fill a 30-day supply. I discussed this with him and he wants us  to send in a 90-day supply anyway. I told him I would postpone until it was due, he did not need to call back.

## 2024-04-08 NOTE — Telephone Encounter (Signed)
 Pt's medications was sent to pt's pharmacy as requested. Confirmation received.

## 2024-04-17 ENCOUNTER — Other Ambulatory Visit: Payer: Self-pay

## 2024-04-17 DIAGNOSIS — F411 Generalized anxiety disorder: Secondary | ICD-10-CM

## 2024-04-17 DIAGNOSIS — F5105 Insomnia due to other mental disorder: Secondary | ICD-10-CM

## 2024-04-17 MED ORDER — LORAZEPAM 1 MG PO TABS: ORAL_TABLET | ORAL | 1 refills | Status: DC

## 2024-04-17 MED ORDER — TRAZODONE HCL 50 MG PO TABS: 50.0000 mg | ORAL_TABLET | Freq: Every evening | ORAL | 0 refills | Status: DC | PRN

## 2024-04-17 NOTE — Telephone Encounter (Signed)
 Sent trazodone , pended [facility]  to [facility] [place].

## 2024-04-20 ENCOUNTER — Other Ambulatory Visit: Payer: Self-pay

## 2024-04-20 ENCOUNTER — Telehealth: Payer: Self-pay | Admitting: Psychiatry

## 2024-04-20 DIAGNOSIS — F411 Generalized anxiety disorder: Secondary | ICD-10-CM

## 2024-04-20 MED ORDER — LORAZEPAM 1 MG PO TABS: ORAL_TABLET | ORAL | 0 refills | Status: AC

## 2024-04-20 NOTE — Telephone Encounter (Signed)
 Next appt is 03/03/25. Requesting a refill on Lorazepam  called to:  [facility] [address] [place], [place]  [address] Phone # is [phone]

## 2024-04-20 NOTE — Telephone Encounter (Signed)
 Canceled Rx at [facility] in [place] and repended to [facility]

## 2024-05-02 ENCOUNTER — Other Ambulatory Visit: Payer: Self-pay | Admitting: Psychiatry

## 2024-05-02 DIAGNOSIS — F411 Generalized anxiety disorder: Secondary | ICD-10-CM

## 2024-05-02 DIAGNOSIS — F5105 Insomnia due to other mental disorder: Secondary | ICD-10-CM

## 2024-05-04 NOTE — Telephone Encounter (Addendum)
 From 12/1 visit:  Ok wean Lexapro  over 2 mos. Doing well with less stress now.   Is he still taking?   Sent MyChart msg

## 2024-06-29 ENCOUNTER — Ambulatory Visit (INDEPENDENT_AMBULATORY_CARE_PROVIDER_SITE_OTHER): Admitting: Psychiatry

## 2024-08-04 ENCOUNTER — Other Ambulatory Visit (HOSPITAL_COMMUNITY)

## 2025-03-03 ENCOUNTER — Ambulatory Visit: Admitting: Psychiatry
